# Patient Record
Sex: Female | Born: 1980 | Hispanic: Yes | Marital: Single | Smoking: Never smoker
Health system: Southern US, Community
[De-identification: ages and names within clinical notes are randomized; demographics above are authoritative.]

## PROBLEM LIST (undated history)

## (undated) DIAGNOSIS — E119 Type 2 diabetes mellitus without complications: Secondary | ICD-10-CM

## (undated) HISTORY — PX: NO PAST SURGERIES: SHX2092

## (undated) HISTORY — DX: Type 2 diabetes mellitus without complications: E11.9

---

## 2015-04-03 LAB — LEAD, BLOOD

## 2015-04-03 LAB — CYTOLOGY - PAP

## 2015-04-04 LAB — SICKLE CELL SCREEN: Sickle Cell Screen: NORMAL

## 2015-04-04 LAB — OB RESULTS CONSOLE GC/CHLAMYDIA
Chlamydia: NEGATIVE
GC PROBE AMP, GENITAL: NEGATIVE

## 2015-04-04 LAB — OB RESULTS CONSOLE HIV ANTIBODY (ROUTINE TESTING): HIV: NONREACTIVE

## 2015-04-04 LAB — OB RESULTS CONSOLE HEPATITIS B SURFACE ANTIGEN: Hepatitis B Surface Ag: NEGATIVE

## 2015-04-04 LAB — OB RESULTS CONSOLE RUBELLA ANTIBODY, IGM: RUBELLA: IMMUNE

## 2015-04-04 LAB — OB RESULTS CONSOLE VARICELLA ZOSTER ANTIBODY, IGG: VARICELLA IGG: IMMUNE

## 2015-04-05 ENCOUNTER — Encounter: Payer: Self-pay | Admitting: *Deleted

## 2015-04-10 ENCOUNTER — Other Ambulatory Visit: Payer: Self-pay | Admitting: Obstetrics & Gynecology

## 2015-04-10 ENCOUNTER — Encounter: Payer: Self-pay | Attending: Family Medicine | Admitting: *Deleted

## 2015-04-10 ENCOUNTER — Ambulatory Visit: Payer: Self-pay | Admitting: *Deleted

## 2015-04-10 DIAGNOSIS — O24112 Pre-existing diabetes mellitus, type 2, in pregnancy, second trimester: Secondary | ICD-10-CM

## 2015-04-10 DIAGNOSIS — Z713 Dietary counseling and surveillance: Secondary | ICD-10-CM | POA: Insufficient documentation

## 2015-04-10 DIAGNOSIS — O24919 Unspecified diabetes mellitus in pregnancy, unspecified trimester: Secondary | ICD-10-CM | POA: Insufficient documentation

## 2015-04-10 DIAGNOSIS — Z794 Long term (current) use of insulin: Secondary | ICD-10-CM | POA: Insufficient documentation

## 2015-04-10 DIAGNOSIS — O24319 Unspecified pre-existing diabetes mellitus in pregnancy, unspecified trimester: Secondary | ICD-10-CM | POA: Insufficient documentation

## 2015-04-10 MED ORDER — GLYBURIDE 5 MG PO TABS: 5.0000 mg | ORAL_TABLET | Freq: Two times a day (BID) | ORAL | Status: AC

## 2015-04-10 NOTE — Progress Notes (Signed)
Patient here for diabetes education.  CBG noted to be 280.  Pt had stopped metformin in jan.  Pt to restart with graduated increase in dose back to 1000 mg (see DM note).  Will also start glyburide 5 mg bid.  Patient has first OB visit with Dr. Shawnie Pons next Monday.  If still not controlled, then consider switching to insulin.

## 2015-04-10 NOTE — Progress Notes (Signed)
Subjective:     Patient ID: Joy Wallace, female   DOB: 12-23-1980, 34 y.o.   MRN: [id number]  HPI   Review of Systems     Objective:   Physical Exam     Assessment:     Patient here for Diabetes Education, 1st Clinic visit. Spanish interpretor and her husband were also present. She initially stated she did not have Diabetes but ultimately stated she was on Metformin since her last pregnancy but stopped taking it when she became pregnant this time. She states she was given 2 insulin injections during her last pregnancy but did not take it herself. She has not been checking her BG.  The following learning objectives were met by the patient during this course:   States the definition of Gestational Diabetes  States when to check blood glucose levels  Demonstrates proper blood glucose monitoring techniques  Understands to resume Rx of Metformin @ 1/2 pill every AM with Breakfast for 1 week, add another 1/2 pill at dinner for the 2nd week.  Understands she has a new Rx for Glyburide to pick up today, and to take 1 pill before Breakfast every day  Blood glucose monitor given: True Track Lot # [id number] Exp: 05/20/2017 Blood glucose reading: 284 mg/dl * I reported this to Dr. Gala Romney who instructed the patient on medications today.  Patient instructed to monitor glucose levels: FBS: 60 - <90 2 hour: <120  *Patient received handouts:  Nutrition Diabetes and Pregnancy in Spanish  Carbohydrate Counting List in Spanish  Patient will be seen for follow-up as needed. She has an appointment next Monday with MD here.      Plan:     Patient instructed to monitor glucose levels: FBS: 60 - <90 2 hour: <120  Take Metformin @ 1/2 pill every AM with Breakfast for 1 week, add another 1/2 pill at dinner for the 2nd week. Glyburide  take 1 pill before Breakfast every day These instructions were written in Spanish by interpretor for the patient.  Patient will be seen for  follow-up as needed. She has an appointment next Monday with MD here.

## 2015-04-17 ENCOUNTER — Ambulatory Visit (INDEPENDENT_AMBULATORY_CARE_PROVIDER_SITE_OTHER): Payer: Self-pay | Admitting: Family Medicine

## 2015-04-17 ENCOUNTER — Encounter: Payer: Self-pay | Admitting: Family Medicine

## 2015-04-17 VITALS — BP 118/83 | HR 88 | Ht 62.99 in | Wt 127.9 lb

## 2015-04-17 DIAGNOSIS — O24919 Unspecified diabetes mellitus in pregnancy, unspecified trimester: Secondary | ICD-10-CM

## 2015-04-17 DIAGNOSIS — O0990 Supervision of high risk pregnancy, unspecified, unspecified trimester: Secondary | ICD-10-CM | POA: Insufficient documentation

## 2015-04-17 DIAGNOSIS — O0992 Supervision of high risk pregnancy, unspecified, second trimester: Secondary | ICD-10-CM

## 2015-04-17 DIAGNOSIS — Z789 Other specified health status: Secondary | ICD-10-CM

## 2015-04-17 DIAGNOSIS — E119 Type 2 diabetes mellitus without complications: Secondary | ICD-10-CM

## 2015-04-17 DIAGNOSIS — O24319 Unspecified pre-existing diabetes mellitus in pregnancy, unspecified trimester: Secondary | ICD-10-CM

## 2015-04-17 LAB — COMPREHENSIVE METABOLIC PANEL
ALK PHOS: 75 U/L (ref 39–117)
ALT: 10 U/L (ref 0–35)
AST: 11 U/L (ref 0–37)
Albumin: 3.4 g/dL — ABNORMAL LOW (ref 3.5–5.2)
BILIRUBIN TOTAL: 0.7 mg/dL (ref 0.2–1.2)
BUN: 8 mg/dL (ref 6–23)
CO2: 21 mEq/L (ref 19–32)
Calcium: 8.8 mg/dL (ref 8.4–10.5)
Chloride: 100 mEq/L (ref 96–112)
Creat: 0.36 mg/dL — ABNORMAL LOW (ref 0.50–1.10)
Glucose, Bld: 196 mg/dL — ABNORMAL HIGH (ref 70–99)
POTASSIUM: 3.9 meq/L (ref 3.5–5.3)
Sodium: 133 mEq/L — ABNORMAL LOW (ref 135–145)
Total Protein: 6.3 g/dL (ref 6.0–8.3)

## 2015-04-17 LAB — POCT URINALYSIS DIP (DEVICE)
BILIRUBIN URINE: NEGATIVE
Glucose, UA: 500 mg/dL — AB
Hgb urine dipstick: NEGATIVE
Ketones, ur: NEGATIVE mg/dL
Leukocytes, UA: NEGATIVE
NITRITE: NEGATIVE
PH: 6.5 (ref 5.0–8.0)
PROTEIN: NEGATIVE mg/dL
Specific Gravity, Urine: 1.02 (ref 1.005–1.030)
Urobilinogen, UA: 0.2 mg/dL (ref 0.0–1.0)

## 2015-04-17 LAB — HEMOGLOBIN A1C
Hgb A1c MFr Bld: 9.5 % — ABNORMAL HIGH (ref <5.7)
Mean Plasma Glucose: 226 mg/dL — ABNORMAL HIGH (ref <117)

## 2015-04-17 LAB — TSH: TSH: 1.293 u[IU]/mL (ref 0.350–4.500)

## 2015-04-17 MED ORDER — INSULIN NPH (HUMAN) (ISOPHANE) 100 UNIT/ML ~~LOC~~ SUSP: 8.0000 [IU] | Freq: Two times a day (BID) | SUBCUTANEOUS | Status: DC

## 2015-04-17 MED ORDER — INSULIN REGULAR HUMAN 100 UNIT/ML IJ SOLN: 8.0000 [IU] | Freq: Two times a day (BID) | INTRAMUSCULAR | Status: DC

## 2015-04-17 MED ORDER — ASPIRIN 81 MG PO CHEW: 81.0000 mg | CHEWABLE_TABLET | Freq: Every day | ORAL | Status: AC

## 2015-04-17 NOTE — Progress Notes (Signed)
Subjective:  Joy Wallace is a 34 y.o. [id number] at [redacted]w[redacted]d being seen today for transfer from [facility] due to DM - Class B and ongoning prenatal care.  Patient reports no complaints.  Contractions: Not present.  Vag. Bleeding: None. Movement: Present. Denies leaking of fluid.   The following portions of the patient's history were reviewed and updated as appropriate: allergies, current medications, past family history, past medical history, past social history, past surgical history and problem list.   Objective:   Filed Vitals:   04/17/15 0749 04/17/15 0750  BP: 118/83   Pulse: 88   Height:  5' 2.99" (1.6 m)  Weight: 127 lb 14.4 oz (58.015 kg)     Fetal Status: Fetal Heart Rate (bpm): 150   Movement: Present     General:  Alert, oriented and cooperative. Patient is in no acute distress.  Skin: Skin is warm and dry. No rash noted.   Cardiovascular: Normal heart rate noted  Respiratory: Normal respiratory effort, no problems with respiration noted  Abdomen: Soft, gravid, appropriate for gestational age. Pain/Pressure: Absent     Vaginal: Vag. Bleeding: None.       Extremities: Normal range of motion.  Edema: None  Mental Status: Normal mood and affect. Normal behavior. Normal judgment and thought content.   Urinalysis:  Protein Negative  Glucose Negative FBS 100-299 2 hr pp 196-507 Assessment and Plan:  Pregnancy: [id number] at [redacted]w[redacted]d  1. Pre-existing diabetes mellitus affecting pregnancy, antepartum Given how high her BS are--will need insulin--begin weight based, second trimester NPH and Regular due to cost and no health insurance. - Comprehensive metabolic panel - Protein, urine, 24 hour - US OB Detail + 14 Wk; Future - Ambulatory referral to Pediatric Cardiology - Ambulatory referral to Ophthalmology - aspirin 81 MG chewable tablet; Chew 1 tablet (81 mg total) by mouth daily.  Dispense: 90 tablet; Refill: 3 - TSH - Hemoglobin A1c - insulin NPH Human (HUMULIN N,NOVOLIN N)  100 UNIT/ML injection; Inject 0.08-0.2 mLs (8-20 Units total) into the skin 2 (two) times daily. 20 units q am, 8 units q hs  Dispense: 10 mL; Refill: 3 - insulin regular (HUMULIN R) 100 units/mL injection; Inject 0.08-0.1 mLs (8-10 Units total) into the skin 2 (two) times daily before a meal. 10 u q am and 8 u q ac evening meal  Dispense: 10 mL; Refill: 11  2. Supervision of high risk pregnancy, antepartum, second trimester Schedule anatomy u/s  3. Type 2 diabetes mellitus without complication Opthalmology referral  4. Language barrier affecting health care Spanish interpreter: [name] used    Preterm labor symptoms and general obstetric precautions including but not limited to vaginal bleeding, contractions, leaking of fluid and fetal movement were reviewed in detail with the patient.  Please refer to After Visit Summary for other counseling recommendations.   Return in 2 weeks (on 05/01/2015).   Reva Bores, MD

## 2015-04-17 NOTE — Progress Notes (Signed)
Nutrition note: 1st visit consult & DM diet education Pt reports she has had Type 2 DM for 7 years but has not received any nutrition education. Pt has gained 9.9# @ 22w, which is wnl. Pt reports eating 3 meals & 2 snacks/d. Pt is taking a PNV. Pt reports no N&V or heartburn. NKFA. Pt reports walking ~30 mins most days. Pt received verbal & written education in Spanish via an interpreter about DM diet during pregnancy. Discussed wt gain goals of 25-35# or 1#/wk. Pt agrees to follow DM diet with 3 meals & 2-3 snacks/d with proper CHO/ protein combination.  Pt has WIC & plans to BF. F/u in 4-6 wks Blondell Reveal, MS, RD, LDN, [facility]

## 2015-04-17 NOTE — Progress Notes (Signed)
Anatomy ultrasound scheduled for 04/19/2015@ 8:30AM Fetal echo with [facility] on 04/27/2015 @ 11:00 [facility] directly to schedule appointment. Verified patient phone numbers and address, ok to leave message per patient. Advised will need Spanish interpreter. Appointment with [facility] scheduled for 04/20/2015 @11 :30 AM. Advised to take oral medications until she can attend class to learn blood sugar monitoring and insulin injections.

## 2015-04-17 NOTE — Patient Instructions (Signed)
Diabetes mellitus gestacional (Gestational Diabetes Mellitus) La diabetes mellitus gestacional, ms comnmente conocida como diabetes gestacional es un tipo de diabetes que desarrollan algunas mujeres durante el embarazo. En la diabetes gestacional, el pncreas no produce suficiente insulina (una hormona) o las clulas son menos sensibles a la insulina producida (resistencia a la insulina), o ambas cosas. Normalmente, la insulina mueve los azcares de los alimentos a las clulas de los tejidos. Las clulas de los tejidos utilizan los azcares para obtener energa. La falta de insulina o la falta de una respuesta normal a la insulina hace que el exceso de azcar se acumule en la sangre en lugar de penetrar en las clulas de los tejidos. Como resultado, se producen niveles altos de azcar en la sangre (hiperglucemia). El efecto de los niveles altos de azcar (glucosa) puede causar muchos problemas.  FACTORES DE RIESGO Usted tiene mayor probabilidad de desarrollar diabetes gestacional si tiene antecedentes familiares de diabetes y tambin si tiene uno o ms de los siguientes factores de riesgo:  ndice de masa corporal superior a 30 (obesidad).  Embarazo previo con diabetes gestacional.  La edad avanzada en el momento del embarazo. Si se mantienen los niveles de glucosa en la sangre en un rango normal durante el embarazo, las mujeres pueden tener un embarazo saludable. Si los niveles de glucosa en la sangre no estn bien controlados, puede haber riesgos para usted, el feto o el recin nacido, o durante el trabajo de parto y el parto.  SNTOMAS  Si se presentan sntomas, stos son similares a los sntomas que normalmente experimentar durante el embarazo. Los sntomas de la diabetes gestacional son:   Aumento de la sed (polidipsia).  Aumento de la miccin (poliuria).  Orina con ms frecuencia durante la noche (nocturia).  Prdida de peso. La prdida de peso puede ser muy rpida.  Infecciones  frecuentes y recurrentes.  Cansancio (fatiga).  Debilidad.  Cambios en la visin, como visin borrosa.  Olor a fruta en el aliento.  Dolor abdominal. DIAGNSTICO La diabetes se diagnostica cuando hay aumento de los niveles de glucosa en la sangre. El nivel de glucosa en la sangre puede controlarse en uno o ms de los siguientes anlisis de sangre:  Medicin de glucosa en la sangre en ayunas. No se le permitir comer durante al menos 8 horas antes de que se tome una muestra de sangre.  Pruebas al azar de glucosa en la sangre. El nivel de glucosa en la sangre se controla en cualquier momento del da sin importar el momento en que haya comido.  Prueba de A1c (hemoglobina glucosilada) Una prueba de A1c proporciona informacin sobre el control de la glucosa en la sangre durante los ltimos 3 meses.  Prueba de tolerancia a la glucosa oral (PTGO). La glucosa en la sangre se mide despus de no haber comido (ayunas) durante una a tres horas y despus de beber una bebida que contenga glucosa. Dado que las hormonas que causan la resistencia a la insulina son ms altas alrededor de las semanas 24 a 28 de embarazo, generalmente se realiza una PTGO durante ese tiempo. Si tiene factores de riesgo de diabetes gestacional, su mdico puede hacerle estudios de deteccin antes de las 24semanas de embarazo. TRATAMIENTO   Usted tendr que tomar medicamentos para la diabetes o insulina diariamente para mantener los niveles de glucosa en la sangre en el rango deseado.  Usted tendr que combinar la dosis de insulina con la actividad fsica y la eleccin de alimentos saludables. El objetivo del   tratamiento es mantener el nivel de azcar en la sangre previo a comer (preprandial) y durante la noche entre 60 y 99mg/dl, durante todo el embarazo. El objetivo del tratamiento es mantener el nivel pico de azcar en la sangre despus de comer (glucosa posprandial) entre 100y 140mg/dl. INSTRUCCIONES PARA EL CUIDADO EN EL  HOGAR   Controle su nivel de hemoglobina A1c dos veces al ao.  Contrlese a diario el nivel de glucosa en la sangre segn las indicaciones de su mdico. Es comn realizar controles frecuentes de la glucosa en la sangre.  Supervise las cetonas en la orina cuando est enferma y segn las indicaciones de su mdico.  Tome el medicamento para la diabetes y adminstrese insulina segn las indicaciones de su mdico para mantener el nivel de glucosa en la sangre en el rango deseado.  Nunca se quede sin medicamento para la diabetes o sin insulina. Es necesario que la reciba todos los das.  Ajuste la insulina segn la ingesta de hidratos de carbono. Los hidratos de carbono pueden aumentar los niveles de glucosa en la sangre, pero deben incluirse en su dieta. Los hidratos de carbono aportan vitaminas, minerales y fibra que son una parte esencial de una dieta saludable. Los hidratos de carbono se encuentran en frutas, verduras, cereales integrales, productos lcteos, legumbres y alimentos que contienen azcares aadidos.  Consuma alimentos saludables. Alterne 3 comidas con 3 colaciones.  Aumente de peso saludablemente. El aumento del peso total vara de acuerdo con el ndice de masa corporal que tena antes del embarazo ([facility]).  Lleve una tarjeta de alerta mdica o use una pulsera o medalla de alerta mdica.  Lleve con usted una colacin de 15gramos de hidratos de carbono en todo momento para controlar los niveles bajos de glucosa en la sangre (hipoglucemia). Algunos ejemplos de colaciones de 15gramos de hidratos de carbono son los siguientes:  Tabletas de glucosa, 3 o 4.  Gel de glucosa, tubo de 15 gramos.  Pasas de uva, 2 cucharadas (24 g).  Caramelos de goma, 6.  Galletas de animales, 8.  Jugo de fruta, gaseosa comn, o leche descremada, 4 onzas (120 ml).  Pastillas de goma, 9.  Reconocer la hipoglucemia. Durante el embarazo la hipoglucemia se produce cuando hay niveles de glucosa en la  sangre de 60 mg/dl o menos. El riesgo de hipoglucemia aumenta durante el ayuno o cuando se saltea las comidas, durante o despus de realizar ejercicio intenso y mientras duerme. Los sntomas de hipoglucemia son:  Temblores o sacudidas.  Disminucin de la capacidad de concentracin.  Sudoracin.  Aumento de la frecuencia cardaca.  Dolor de cabeza.  Sequedad en la boca.  Hambre.  Irritabilidad.  Ansiedad.  Sueo agitado.  Alteracin del habla o de la coordinacin.  Confusin.  Tratar la hipoglucemia rpidamente. Si usted est alerta y puede tragar con seguridad, siga la regla de 15/15 que consiste en:  Tome entre 15 y 20gramos de glucosa de accin rpida o carbohidratos. Las opciones de accin rpida son un gel de glucosa, tabletas de glucosa, o 4 onzas (120 ml) de jugo de frutas, gaseosa comn, o leche baja en grasa.  Compruebe su nivel de glucosa en la sangre 15 minutos despus de tomar la glucosa.  Tome entre 15 y 20 gramos ms de glucosa si el nivel de glucosa en la sangre todava es de 70mg/dl o inferior.  Ingiera una comida o una colacin en el lapso de 1 hora una vez que los niveles de glucosa en la sangre vuelven   a la normalidad.  Est atento a la poliuria (miccin excesiva) y la polidipsia (sensacin de mucha sed), que son los primeros signos de la hiperglucemia. El reconocimiento temprano de la hiperglucemia permite un tratamiento oportuno. Trate la hiperglucemia segn le indic su mdico.  Haga actividad fsica por lo menos 30minutos al da o como lo indique su mdico. Se recomienda que 30 minutos despus de cada comida, realice diez minutos de actividad fsica para controlar los niveles de glucosa postprandial en la sangre.  Ajuste su dosis de insulina y la ingesta de alimentos, segn sea necesario, si inicia un nuevo ejercicio o deporte.  Siga su plan para los das de enfermedad cuando no pueda comer o beber como de costumbre.  Evite el tabaco y el  alcohol.  Concurra a todas las visitas de control como se lo haya indicado el mdico.  Siga el consejo del mdico respecto a los controles prenatales y posteriores al parto (postparto), las visitas, la planificacin de las comidas, el ejercicio, los medicamentos, las vitaminas, los anlisis de sangre, otras pruebas mdicas y actividades fsicas.  Realice diariamente el cuidado de la piel y de los pies. Examine su piel y los pies diariamente para ver si tiene cortes, moretones, enrojecimiento, problemas en las uas, sangrado, ampollas o llagas.  Cepllese los dientes y encas por lo menos dos veces al da y use hilo dental al menos una vez por da. Concurra regularmente a las visitas de control con el dentista.  Programe un examen de vista durante el primer trimestre de su embarazo o como lo indique su mdico.  Comparta su plan de control de diabetes en el trabajo o en la escuela.  Mantngase al da con las vacunas.  Aprenda a manejar el estrs.  Obtenga la mayor cantidad posible de informacin sobre la diabetes y solicite ayuda siempre que sea necesario.  Obtenga informacin sobre el amamantamiento y analice esta posibilidad.  Debe controlar el nivel de azcar en la sangre de 6a 12semanas despus del parto. Esto se hace con una prueba de tolerancia a la glucosa oral (PTGO). SOLICITE ATENCIN MDICA SI:   No puede comer alimentos o beber por ms de 6 horas.  Tuvo nuseas o ha vomitado durante ms de 6 horas.  Tiene un nivel de glucosa en la sangre de 200 mg/dl y cetonas en la orina.  Presenta algn cambio en el estado mental.  Desarrolla problemas de visin.  Sufre un dolor persistente de cabeza.  Siente dolor o molestias en la parte superior del abdomen.  Desarrolla una enfermedad grave adicional.  Tuvo diarrea durante ms de 6 horas.  Ha estado enfermo o ha tenido fiebre durante un par de das y no mejora. SOLICITE ATENCIN MDICA DE INMEDIATO SI:   Tiene dificultad  para respirar.  Ya no siente los movimientos del beb.  Est sangrando o tiene flujo vaginal.  Comienza a tener contracciones o trabajo de parto prematuro. ASEGRESE DE QUE:  Comprende estas instrucciones.  Controlar su afeccin.  Recibir ayuda de inmediato si no mejora o si empeora. Document Released: 07/17/2005 Document Revised: 02/21/2014 ExitCare Patient Information 2015 [employer]. This information is not intended to replace advice given to you by your health care provider. Make sure you discuss any questions you have with your health care provider.  Segundo trimestre de embarazo (Second Trimester of Pregnancy) El segundo trimestre va desde la semana13 hasta la 28, desde el cuarto hasta el sexto mes, y suele ser el momento en el que mejor se   siente. Su organismo se ha adaptado a Charity fundraiser y comienza a Diplomatic Services operational officer. En general, las nuseas matutinas han disminuido o han desaparecido completamente, p El segundo trimestre es tambin la poca en la que el feto se desarrolla rpidamente. Hacia el final del sexto mes, el feto mide aproximadamente 9pulgadas (23cm) y pesa alrededor de 1 libras (700g). Es probable que sienta que el beb se Teacher, English as a foreign language (da pataditas) entre las 18 y 20semanas del Psychiatrist. CAMBIOS EN EL ORGANISMO Su organismo atraviesa por muchos cambios durante el [place], y estos varan de Neomia Dear mujer a Educational psychologist.   Seguir [employer]. Notar que la parte baja del abdomen sobresale.  Podrn aparecer las primeras [employer] caderas, el abdomen y las [place].  Es posible que tenga dolores de cabeza que pueden aliviarse con los medicamentos que su mdico autorice.  Tal vez tenga necesidad de orinar con ms frecuencia porque el feto est ejerciendo presin Ambulance person.  Debido al Vanetta Mulders podr sentir Anthoney Harada estomacal con frecuencia.  Puede estar estreida, ya que ciertas hormonas enlentecen los movimientos de los msculos que [employer]  desechos a travs de los intestinos.  Pueden aparecer hemorroides o abultarse e hincharse las venas (venas varicosas).  Puede tener dolor de espalda que se debe al [employer] de peso y a que las hormonas del Management consultant las articulaciones entre los huesos de la pelvis, y Public librarian consecuencia de la modificacin del peso y los msculos que mantienen el equilibrio.  Las [employer] seguirn creciendo y Development worker, community.  Las Veterinary surgeon y estar sensibles al cepillado y al hilo dental.  Pueden aparecer zonas oscuras o manchas (cloasma, mscara del Psychiatrist) en el rostro que probablemente se atenuarn despus del nacimiento del beb.  Es posible que se forme una lnea oscura desde el ombligo hasta la zona del pubis (linea nigra) que probablemente se atenuarn despus del nacimiento del beb.  Tal vez haya cambios en el cabello que pueden incluir su engrosamiento, crecimiento rpido y cambios en la textura. Adems, a algunas mujeres se les cae el cabello durante o despus del embarazo, o tienen el cabello seco o fino. Lo ms probable es que el cabello se le normalice despus del nacimiento del beb. QU DEBE ESPERAR EN LAS CONSULTAS PRENATALES Durante una visita prenatal de rutina:  La pesarn para asegurarse de que usted y el feto estn creciendo normalmente.  Le tomarn la presin arterial.  Le medirn el abdomen para controlar el desarrollo del beb.  Se escucharn los latidos cardacos fetales.  Se evaluarn los resultados de los estudios solicitados en visitas anteriores. El mdico puede preguntarle lo siguiente:  Cmo se siente.  Si siente los movimientos del beb.  Si ha tenido sntomas anormales, como prdida de lquido, [place], dolores de cabeza intensos o clicos abdominales.  Si tiene [employer]. Otros estudios que podrn realizarse durante el segundo trimestre incluyen lo siguiente:  Anlisis de sangre para detectar:  Concentraciones de hierro bajas  (anemia).  Diabetes gestacional (entre la semana 24 y la 28).  Anticuerpos Rh.  Anlisis de orina para detectar infecciones, diabetes o protenas en la orina.  Una ecografa para confirmar que el beb crece y se desarrolla correctamente.  Una amniocentesis para diagnosticar posibles problemas genticos.  Estudios del feto para descartar espina bfida y sndrome de [place]. INSTRUCCIONES PARA EL CUIDADO EN EL HOGAR   Evite fumar, consumir hierbas, beber alcohol y tomar frmacos que no le hayan recetado. Estas sustancias qumicas afectan la formacin  y el desarrollo del beb.  Siga las indicaciones del mdico en relacin con el uso de medicamentos. Durante el embarazo, hay medicamentos que son seguros de tomar y otros que no.  Haga actividad fsica solo en la forma indicada por el mdico. Sentir clicos uterinos es un buen signo para detener la actividad fsica.  Contine comiendo alimentos que sanos con regularidad.  Use un sostn que le brinde buen soporte si le duelen las mamas.  No se d baos de inmersin en agua caliente, baos turcos ni saunas.  Colquese el cinturn de seguridad cuando conduzca.  No coma carne cruda ni queso sin cocinar; evite el contacto con las bandejas sanitarias de los gatos y la tierra que estos animales usan. Estos elementos contienen grmenes que pueden causar defectos congnitos en el beb.  Tome las vitaminas prenatales.  Si est estreida, pruebe un laxante suave (si el mdico lo autoriza). Consuma ms alimentos ricos en fibra, como vegetales y frutas frescos y cereales integrales. Beba gran cantidad de lquido para mantener la orina de tono claro o color amarillo plido.  Dese baos de asiento con agua tibia para aliviar el dolor o las molestias causadas por las hemorroides. Use una crema para las hemorroides si el mdico la autoriza.  Si tiene venas varicosas, use medias de descanso. Eleve los pies durante 15minutos, 3 o 4veces por da. Limite la  cantidad de sal en su dieta.  No levante objetos pesados, use zapatos de tacones bajos y mantenga una buena postura.  Descanse con las piernas elevadas si tiene calambres o dolor de cintura.  Visite a su dentista si an no lo ha hecho durante el embarazo. Use un cepillo de dientes blando para higienizarse los dientes y psese el hilo dental con suavidad.  Puede seguir manteniendo relaciones sexuales, a menos que el mdico le indique lo contrario.  Concurra a todas las visitas prenatales segn las indicaciones de su mdico. SOLICITE ATENCIN MDICA SI:   Tiene mareos.  Siente clicos leves, presin en la pelvis o dolor persistente en el abdomen.  Tiene nuseas, vmitos o diarrea persistentes.  Tiene secrecin vaginal con mal olor.  Siente dolor al orinar. SOLICITE ATENCIN MDICA DE INMEDIATO SI:   Tiene fiebre.  Tiene una prdida de lquido por la vagina.  Tiene sangrado o pequeas prdidas vaginales.  Siente dolor intenso o clicos en el abdomen.  Sube o baja de peso rpidamente.  Tiene dificultad para respirar y siente dolor de pecho.  Sbitamente se le hinchan mucho el rostro, las manos, los tobillos, los pies o las piernas.  No ha sentido los movimientos del beb durante una hora.  Siente un dolor de cabeza intenso que no se alivia con medicamentos.  Hay cambios en la visin. Document Released: 07/17/2005 Document Revised: 10/12/2013 ExitCare Patient Information 2015 [employer]. This information is not intended to replace advice given to you by your health care provider. Make sure you discuss any questions you have with your health care provider.  Lactancia materna (Breastfeeding) Decidir amamantar es una de las mejores elecciones que puede hacer por usted y su beb. El cambio hormonal durante el embarazo produce el desarrollo del tejido mamario y aumenta la cantidad y el tamao de los conductos galactforos. Estas hormonas tambin permiten que las protenas,  los azcares y las grasas de la sangre produzcan la leche materna en las glndulas productoras de leche. Las hormonas impiden que la leche materna sea liberada antes del nacimiento del beb, adems de impulsar el flujo   de leche luego del nacimiento. Una vez que ha comenzado a amamantar, pensar en el beb, as como la succin o el llanto, pueden estimular la liberacin de leche de las glndulas productoras de leche.  LOS BENEFICIOS DE AMAMANTAR Para el beb  La primera leche (calostro) ayuda a mejorar el funcionamiento del sistema digestivo del beb.  La leche tiene anticuerpos que ayudan a prevenir las infecciones en el beb.  El beb tiene una menor incidencia de asma, alergias y del sndrome de muerte sbita del lactante.  Los nutrientes en la leche materna son mejores para el beb que la leche maternizada y estn preparados exclusivamente para cubrir las necesidades del beb.  La leche materna mejora el desarrollo cerebral del beb.  Es menos probable que el beb desarrolle otras enfermedades, como obesidad infantil, asma o diabetes mellitus de tipo 2. Para usted   La lactancia materna favorece el desarrollo de un vnculo muy especial entre la madre y el beb.  Es conveniente. La leche materna siempre est disponible a la temperatura correcta y es econmica.  La lactancia materna ayuda a quemar caloras y a perder el peso ganado durante el embarazo.  Favorece la contraccin del tero al tamao que tena antes del embarazo de manera ms rpida y disminuye el sangrado (loquios) despus del parto.  La lactancia materna contribuye a reducir el riesgo de desarrollar diabetes mellitus de tipo 2, osteoporosis o cncer de mama o de ovario en el futuro. SIGNOS DE QUE EL BEB EST HAMBRIENTO Primeros signos de hambre  Aumenta su estado de alerta o actividad.  Se estira.  Mueve la cabeza de un lado a otro.  Mueve la cabeza y abre la boca cuando se le toca la mejilla o la comisura de la  boca (reflejo de bsqueda).  Aumenta las vocalizaciones, tales como sonidos de succin, se relame los labios, emite arrullos, suspiros, o chirridos.  Mueve la mano hacia la boca.  Se chupa con ganas los dedos o las manos. Signos tardos de hambre  Est agitado.  Llora de manera intermitente. Signos de hambre extrema Los signos de hambre extrema requerirn que lo calme y lo consuele antes de que el beb pueda alimentarse adecuadamente. No espere a que se manifiesten los siguientes signos de hambre extrema para comenzar a amamantar:   Agitacin.  Llanto intenso y fuerte.   Gritos. INFORMACIN BSICA SOBRE LA LACTANCIA MATERNA Iniciacin de la lactancia materna  Encuentre un lugar cmodo para sentarse o acostarse, con un buen respaldo para el cuello y la espalda.  Coloque una almohada o una manta enrollada debajo del beb para acomodarlo a la altura de la mama (si est sentada). Las almohadas para amamantar se han diseado especialmente a fin de servir de apoyo para los brazos y el beb mientras amamanta.  Asegrese de que el abdomen del beb est frente al suyo.  Masajee suavemente la mama. Con las yemas de los dedos, masajee la pared del pecho hacia el pezn en un movimiento circular. Esto estimula el flujo de leche. Es posible que deba continuar este movimiento mientras amamanta si la leche fluye lentamente.  Sostenga la mama con el pulgar por arriba del pezn y los otros 4 dedos por debajo de la mama. Asegrese de que los dedos se encuentren lejos del pezn y de la boca del beb.  Empuje suavemente los labios del beb con el pezn o con el dedo.  Cuando la boca del beb se abra lo suficiente, acrquelo rpidamente a la mama   e introduzca todo el pezn y la zona oscura que lo rodea (areola), tanto como sea posible, dentro de la boca del beb.  Debe haber ms areola visible por arriba del labio superior del beb que por debajo del labio inferior.  La lengua del beb debe estar  entre la enca inferior y la mama.  Asegrese de que la boca del beb est en la posicin correcta alrededor del pezn (prendida). Los labios del beb deben crear un sello sobre la mama y estar doblados hacia afuera (invertidos).  Es comn que el beb succione durante 2 a 3 minutos para que comience el flujo de leche materna. Cmo debe prenderse Es muy importante que le ensee al beb cmo prenderse adecuadamente a la mama. Si el beb no se prende adecuadamente, puede causarle dolor en el pezn y reducir la produccin de leche materna, y hacer que el beb tenga un escaso aumento de peso. Adems, si el beb no se prende adecuadamente al pezn, puede tragar aire durante la alimentacin. Esto puede causarle molestias al beb. Hacer eructar al beb al cambiar de mama puede ayudarlo a liberar el aire. Sin embargo, ensearle al beb cmo prenderse a la mama adecuadamente es la mejor manera de evitar que se sienta molesto por tragar aire mientras se alimenta. Signos de que el beb se ha prendido adecuadamente al pezn:   Tironea o succiona de modo silencioso, sin causarle dolor.  Se escucha que traga cada 3 o 4 succiones.   Hay movimientos musculares por arriba y por delante de sus odos al succionar. Signos de que el beb no se ha prendido adecuadamente al pezn:   Hace ruidos de succin o de chasquido mientras se alimenta.  Siente dolor en el pezn. Si cree que el beb no se prendi correctamente, deslice el dedo en la comisura de la boca y colquelo entre las encas del beb para interrumpir la succin. Intente comenzar a amamantar nuevamente. Signos de lactancia materna exitosa Signos del beb:   Disminuye gradualmente el nmero de succiones o cesa la succin por completo.  Se duerme.  Relaja el cuerpo.  Retiene una pequea cantidad de leche en la boca.  Se desprende solo del pecho. Signos que presenta usted:  Las mamas han aumentado la firmeza, el peso y el tamao 1 a 3 horas despus  de amamantar.  Estn ms blandas inmediatamente despus de amamantar.  Un aumento del volumen de leche, y tambin un cambio en su consistencia y color se producen hacia el quinto da de lactancia materna.  Los pezones no duelen, ni estn agrietados ni sangran. Signos de que su beb recibe la cantidad de leche suficiente  Moja al menos 3 paales en 24 horas. La orina debe ser clara y de color amarillo plido a los 5 das de vida.  Defeca al menos 3 veces en 24 horas a los 5 das de vida. La materia fecal debe ser blanda y amarillenta.  Defeca al menos 3 veces en 24 horas a los 7 das de vida. La materia fecal debe ser grumosa y amarillenta.  No registra una prdida de peso mayor del 10% del peso al nacer durante los primeros 3 das de vida.  Aumenta de peso un promedio de 4 a 7onzas (113 a 198g) por semana despus de los 4 das de vida.  Aumenta de peso, diariamente, de manera uniforme a partir de los 5 das de vida, sin registrar prdida de peso despus de las 2semanas de vida. Despus de alimentarse,   es posible que el beb regurgite una pequea cantidad. Esto es frecuente. FRECUENCIA Y DURACIN DE LA LACTANCIA MATERNA El amamantamiento frecuente la ayudar a producir ms leche y a prevenir problemas de dolor en los pezones e hinchazn en las mamas. Alimente al beb cuando muestre signos de hambre o si siente la necesidad de reducir la congestin de las mamas. Esto se denomina "lactancia a demanda". Evite el uso del chupete mientras trabaja para establecer la lactancia (las primeras 4 a 6 semanas despus del nacimiento del beb). Despus de este perodo, podr ofrecerle un chupete. Las investigaciones demostraron que el uso del chupete durante el primer ao de vida del beb disminuye el riesgo de desarrollar el sndrome de muerte sbita del lactante (SMSL). Permita que el nio se alimente en cada mama todo lo que desee. Contine amamantando al beb hasta que haya terminado de alimentarse.  Cuando el beb se desprende o se queda dormido mientras se est alimentando de la primera mama, ofrzcale la segunda. Debido a que, con frecuencia, los recin nacidos permanecen somnolientos las primeras semanas de vida, es posible que deba despertar al beb para alimentarlo. Los horarios de lactancia varan de un beb a otro. Sin embargo, las siguientes reglas pueden servir como gua para ayudarla a garantizar que el beb se alimenta adecuadamente:  Se puede amamantar a los recin nacidos (bebs de 4 semanas o menos de vida) cada 1 a 3 horas.  No deben transcurrir ms de 3 horas durante el da o 5 horas durante la noche sin que se amamante a los recin nacidos.  Debe amamantar al beb 8 veces como mnimo en un perodo de 24 horas, hasta que comience a introducir slidos en su dieta, a los 6 meses de vida aproximadamente. EXTRACCIN DE LECHE MATERNA La extraccin y el almacenamiento de la leche materna le permiten asegurarse de que el beb se alimente exclusivamente de leche materna, aun en momentos en los que no puede amamantar. Esto tiene especial importancia si debe regresar al trabajo en el perodo en que an est amamantando o si no puede estar presente en los momentos en que el beb debe alimentarse. Su asesor en lactancia puede orientarla sobre cunto tiempo es seguro almacenar leche materna.  El sacaleche es un aparato que le permite extraer leche de la mama a un recipiente estril. Luego, la leche materna extrada puede almacenarse en un refrigerador o congelador. Algunos sacaleches son manuales, mientras que otros son elctricos. Consulte a su asesor en lactancia qu tipo ser ms conveniente para usted. Los sacaleches se pueden comprar; sin embargo, algunos hospitales y grupos de apoyo a la lactancia materna alquilan sacaleches mensualmente. Un asesor en lactancia puede ensearle cmo extraer leche materna manualmente, en caso de que prefiera no usar un sacaleche.  CMO CUIDAR LAS MAMAS DURANTE  LA LACTANCIA MATERNA Los pezones se secan, agrietan y duelen durante la lactancia materna. Las siguientes recomendaciones pueden ayudarla a mantener las mamas humectadas y sanas:  Evite usar jabn en los pezones.  Use un sostn de soporte. Aunque no son esenciales, las camisetas sin mangas o los sostenes especiales para amamantar estn diseados para acceder fcilmente a las mamas, para amamantar sin tener que quitarse todo el sostn o la camiseta. Evite usar sostenes con aro o sostenes muy ajustados.  Seque al aire sus pezones durante 3 a 4minutos despus de amamantar al beb.  Utilice solo apsitos de algodn en el sostn para absorber las prdidas de leche. La prdida de un poco de leche   materna entre las tomas es normal.  Utilice lanolina sobre los pezones luego de amamantar. La lanolina ayuda a mantener la humedad normal de la piel. Si usa lanolina pura, no tiene que lavarse los pezones antes de volver a alimentar al beb. La lanolina pura no es txica para el beb. Adems, puede extraer manualmente algunas gotas de leche materna y masajear suavemente esa leche sobre los pezones, para que la leche se seque al aire. Durante las primeras semanas despus de dar a luz, algunas mujeres pueden experimentar hinchazn en las mamas (congestin mamaria). La congestin puede hacer que sienta las mamas pesadas, calientes y sensibles al tacto. El pico de la congestin ocurre dentro de los 3 a 5 das despus del parto. Las siguientes recomendaciones pueden ayudarla a aliviar la congestin:  Vace por completo las mamas al amamantar o extraer leche. Puede aplicar calor hmedo en las mamas (en la ducha o con toallas hmedas para manos) antes de amamantar o extraer leche. Esto aumenta la circulacin y ayuda a que la leche fluya. Si el beb no vaca por completo las mamas cuando lo amamanta, extraiga la leche restante despus de que haya finalizado.  Use un sostn ajustado (para amamantar o comn) o una camiseta  sin mangas durante 1 o 2 das para indicar al cuerpo que disminuya ligeramente la produccin de leche.  Aplique compresas de hielo sobre las mamas, a menos que le resulte demasiado incmodo.  Asegrese de que el beb est prendido y se encuentre en la posicin correcta mientras lo alimenta. Si la congestin persiste luego de 48 horas o despus de seguir estas recomendaciones, comunquese con su mdico o un asesor en lactancia. RECOMENDACIONES GENERALES PARA EL CUIDADO DE LA SALUD DURANTE LA LACTANCIA MATERNA  Consuma alimentos saludables. Alterne comidas y colaciones, y coma 3 de cada una por da. Dado que lo que come afecta la leche materna, es posible que algunas comidas hagan que su beb se vuelva ms irritable de lo habitual. Evite comer este tipo de alimentos si percibe que afectan de manera negativa al beb.  Beba leche, jugos de fruta y agua para satisfacer su sed (aproximadamente 10 vasos al da).  Descanse con frecuencia, reljese y tome sus vitaminas prenatales para evitar la fatiga, el estrs y la anemia.  Contine con los autocontroles de la mama.  Evite masticar y fumar tabaco.  Evite el consumo de alcohol y drogas. Algunos medicamentos, que pueden ser perjudiciales para el beb, pueden pasar a travs de la leche materna. Es importante que consulte a su mdico antes de tomar cualquier medicamento, incluidos todos los medicamentos recetados y de venta libre, as como los suplementos vitamnicos y herbales. Puede quedar embarazada durante la lactancia. Si desea controlar la natalidad, consulte a su mdico cules son las opciones ms seguras para el beb. SOLICITE ATENCIN MDICA SI:   Usted siente que quiere dejar de amamantar o se siente frustrada con la lactancia.  Siente dolor en las mamas o en los pezones.  Sus pezones estn agrietados o sangran.  Sus pechos estn irritados, sensibles o calientes.  Tiene un rea hinchada en cualquiera de las mamas.  Siente escalofros  o fiebre.  Tiene nuseas o vmitos.  Presenta una secrecin de otro lquido distinto de la leche materna de los pezones.  Sus mamas no se llenan antes de amamantar al beb para el quinto da despus del parto.  Se siente triste y deprimida.  El beb est demasiado somnoliento como para comer bien.  El beb   tiene problemas para dormir.  Moja menos de 3 paales en 24 horas.  Defeca menos de 3 veces en 24 horas.  La piel del beb o la parte blanca de los ojos se vuelven amarillentas.  El beb no ha aumentado de peso a los 5 das de vida. SOLICITE ATENCIN MDICA DE INMEDIATO SI:   El beb est muy cansado (letargo) y no se quiere despertar para comer.  Le sube la fiebre sin causa. Document Released: 10/07/2005 Document Revised: 10/12/2013 Exit[employer] Patient Information 2015 [employer]. This information is not intended to replace advice given to you by your health care provider. Make sure you discuss any questions you have with your health care provider.  

## 2015-04-17 NOTE — Progress Notes (Signed)
Pt reports that she had labs and pap at [facility]. Results not yet received.

## 2015-04-19 ENCOUNTER — Ambulatory Visit (HOSPITAL_COMMUNITY)
Admission: RE | Admit: 2015-04-19 | Discharge: 2015-04-19 | Disposition: A | Discharge status: Home / Self Care | Payer: Self-pay | Source: Ambulatory Visit | Attending: Family Medicine | Admitting: Family Medicine

## 2015-04-19 VITALS — BP 117/89 | HR 103 | Wt 131.2 lb

## 2015-04-19 DIAGNOSIS — O0992 Supervision of high risk pregnancy, unspecified, second trimester: Secondary | ICD-10-CM

## 2015-04-19 DIAGNOSIS — O24319 Unspecified pre-existing diabetes mellitus in pregnancy, unspecified trimester: Secondary | ICD-10-CM

## 2015-04-19 DIAGNOSIS — Z3A19 19 weeks gestation of pregnancy: Secondary | ICD-10-CM | POA: Insufficient documentation

## 2015-04-19 DIAGNOSIS — Z3689 Encounter for other specified antenatal screening: Secondary | ICD-10-CM | POA: Insufficient documentation

## 2015-04-19 DIAGNOSIS — O24919 Unspecified diabetes mellitus in pregnancy, unspecified trimester: Secondary | ICD-10-CM | POA: Insufficient documentation

## 2015-04-20 ENCOUNTER — Telehealth (HOSPITAL_COMMUNITY): Payer: Self-pay | Admitting: MS"

## 2015-04-20 ENCOUNTER — Ambulatory Visit (HOSPITAL_COMMUNITY): Payer: Self-pay | Attending: Family Medicine

## 2015-04-20 ENCOUNTER — Encounter (HOSPITAL_COMMUNITY): Payer: Self-pay

## 2015-04-20 ENCOUNTER — Encounter: Payer: Self-pay | Admitting: *Deleted

## 2015-04-20 VITALS — Ht 61.5 in | Wt 131.6 lb

## 2015-04-20 DIAGNOSIS — O24319 Unspecified pre-existing diabetes mellitus in pregnancy, unspecified trimester: Secondary | ICD-10-CM

## 2015-04-20 NOTE — Telephone Encounter (Signed)
Called patient via Spanish/English interpreter. Patient did not show for genetic counseling today. She stated that she thought that appointment was only if she was planning to have an abortion, and she has decided not to have an abortion, so she went to her diabetic education appointment instead. Stated that is helpful information to know but that it would still be helpful to meet with the patient to discuss other information, particularly since she is planning to continue the pregnancy. Discussed options of rescheduling genetic counseling at any point and that it could also be coordinated with a follow-up ultrasound. Patient stated that Monday mornings work best for her and that she would like to coordinate genetic counseling with an ultrasound visit. We planned to discuss best timing of follow-up with physician and call patient back with follow-up appointment. Patient inquired about Panorama test and if she could pay amount in increments, if she elected to do this test. Discussed that this is likely an option but that we would discuss the pros and cons of this testing option in more detail when she comes for genetic counseling appointment.   Clydie BraunKaren Zita Ozimek 04/20/2015 11:50 AM

## 2015-04-21 ENCOUNTER — Telehealth: Payer: Self-pay | Admitting: General Practice

## 2015-04-21 DIAGNOSIS — O24414 Gestational diabetes mellitus in pregnancy, insulin controlled: Secondary | ICD-10-CM

## 2015-04-21 LAB — PROTEIN, URINE, 24 HOUR
Protein, 24H Urine: 106 mg/d (ref <150)
Protein, Urine: 4 mg/dL — ABNORMAL LOW (ref 5–24)

## 2015-04-21 MED ORDER — INSULIN REGULAR HUMAN 100 UNIT/ML IJ SOLN: 8.0000 [IU] | Freq: Two times a day (BID) | INTRAMUSCULAR | Status: DC

## 2015-04-21 MED ORDER — INSULIN NPH (HUMAN) (ISOPHANE) 100 UNIT/ML ~~LOC~~ SUSP: 8.0000 [IU] | Freq: Two times a day (BID) | SUBCUTANEOUS | Status: AC

## 2015-04-21 NOTE — Telephone Encounter (Signed)
Patient is spanish speaking. Called in to front office and spoke with Alanda AmassBeronica stating she went to her pharmacy to get insulin and it was $300. Patient states she cannot afford that and is out of insulin. Called [facility] and pharmacist states if we change Rx to Novolin instead of Humulin medication will be $25 a vial. Meds refilled. Beronica called patient back and informed her of new prescriptions at pharmacy. Patient verbalizes understanding

## 2015-04-25 ENCOUNTER — Encounter: Payer: Self-pay | Admitting: *Deleted

## 2015-04-25 DIAGNOSIS — O24319 Unspecified pre-existing diabetes mellitus in pregnancy, unspecified trimester: Secondary | ICD-10-CM

## 2015-04-25 DIAGNOSIS — O0992 Supervision of high risk pregnancy, unspecified, second trimester: Secondary | ICD-10-CM

## 2015-04-26 NOTE — Progress Notes (Signed)
  Insulin Instruction  Patient was seen on 04/20/15 for insulin instruction.   Interpretor present. A1c on 04/17/15: 9.5%. She states she had an ultra sound yesterday and she is very worried about her baby.   The following learning objectives were met by the patient during this visit:   Insulin Action of Regular and NPH insulins  Reviewed syringe & vial VS pen including # units per syringe,    length of needles, vial VS Pen cartridge and needles  Hygiene and storage  Drawing up single and mixed doses if using vials   Single dose   Mixed dose:   Rotation of Sites  Hypoglycemia- symptoms, causes , treatment choices  Record keeping and MD follow up  Hypoglycemia, causes, symptoms and treatment   Patient demonstrated understanding of insulin administration by return demonstration.  Patient received the following handouts:  Insulin Instruction Handout with instructions written in Spanish adjacent to Frederic by the Interpretor  Mixing Insulin Brochure by [facility] Getting Started                                        Patient to start on insulin as Rx'd by MD  Patient will be seen for follow-up as needed.

## 2015-05-01 ENCOUNTER — Encounter: Payer: Self-pay | Admitting: Family Medicine

## 2015-05-01 ENCOUNTER — Ambulatory Visit (INDEPENDENT_AMBULATORY_CARE_PROVIDER_SITE_OTHER): Payer: Self-pay | Admitting: Family Medicine

## 2015-05-01 ENCOUNTER — Encounter: Payer: Self-pay | Attending: Family Medicine | Admitting: *Deleted

## 2015-05-01 VITALS — BP 127/80 | HR 96 | Temp 97.8°F | Wt 131.6 lb

## 2015-05-01 DIAGNOSIS — O350XX1 Maternal care for (suspected) central nervous system malformation in fetus, fetus 1: Secondary | ICD-10-CM

## 2015-05-01 DIAGNOSIS — O3505X0 Maternal care for (suspected) central nervous system malformation or damage in fetus, holoprosencephaly, not applicable or unspecified: Secondary | ICD-10-CM | POA: Insufficient documentation

## 2015-05-01 DIAGNOSIS — O24319 Unspecified pre-existing diabetes mellitus in pregnancy, unspecified trimester: Secondary | ICD-10-CM

## 2015-05-01 DIAGNOSIS — Z794 Long term (current) use of insulin: Secondary | ICD-10-CM | POA: Insufficient documentation

## 2015-05-01 DIAGNOSIS — O3505X1 Maternal care for (suspected) central nervous system malformation or damage in fetus, holoprosencephaly, fetus 1: Secondary | ICD-10-CM

## 2015-05-01 DIAGNOSIS — Z3492 Encounter for supervision of normal pregnancy, unspecified, second trimester: Secondary | ICD-10-CM

## 2015-05-01 DIAGNOSIS — O0992 Supervision of high risk pregnancy, unspecified, second trimester: Secondary | ICD-10-CM

## 2015-05-01 DIAGNOSIS — O24919 Unspecified diabetes mellitus in pregnancy, unspecified trimester: Secondary | ICD-10-CM | POA: Insufficient documentation

## 2015-05-01 DIAGNOSIS — O350XX0 Maternal care for (suspected) central nervous system malformation in fetus, not applicable or unspecified: Secondary | ICD-10-CM | POA: Insufficient documentation

## 2015-05-01 DIAGNOSIS — O24912 Unspecified diabetes mellitus in pregnancy, second trimester: Secondary | ICD-10-CM

## 2015-05-01 DIAGNOSIS — Z713 Dietary counseling and surveillance: Secondary | ICD-10-CM | POA: Insufficient documentation

## 2015-05-01 LAB — POCT URINALYSIS DIP (DEVICE)
Bilirubin Urine: NEGATIVE
Glucose, UA: 500 mg/dL — AB
Ketones, ur: 40 mg/dL — AB
Nitrite: NEGATIVE
Protein, ur: NEGATIVE mg/dL
SPECIFIC GRAVITY, URINE: 1.02 (ref 1.005–1.030)
UROBILINOGEN UA: 0.2 mg/dL (ref 0.0–1.0)
pH: 7.5 (ref 5.0–8.0)

## 2015-05-01 MED ORDER — INSULIN REGULAR HUMAN 100 UNIT/ML IJ SOLN: 8.0000 [IU] | Freq: Two times a day (BID) | INTRAMUSCULAR | Status: AC

## 2015-05-01 NOTE — Patient Instructions (Signed)
Segundo trimestre de embarazo (Second Trimester of Pregnancy) El segundo trimestre va desde la semana13 hasta la 28, desde el cuarto hasta el sexto mes, y suele ser el momento en el que mejor se siente. Su organismo se ha adaptado a estar embarazada y comienza a sentirse fsicamente mejor. En general, las nuseas matutinas han disminuido o han desaparecido completamente, p El segundo trimestre es tambin la poca en la que el feto se desarrolla rpidamente. Hacia el final del sexto mes, el feto mide aproximadamente 9pulgadas (23cm) y pesa alrededor de 1 libras (700g). Es probable que sienta que el beb se mueve (da pataditas) entre las 18 y 20semanas del embarazo. CAMBIOS EN EL ORGANISMO Su organismo atraviesa por muchos cambios durante el embarazo, y estos varan de una mujer a otra.   Seguir aumentando de peso. Notar que la parte baja del abdomen sobresale.  Podrn aparecer las primeras estras en las caderas, el abdomen y las mamas.  Es posible que tenga dolores de cabeza que pueden aliviarse con los medicamentos que su mdico autorice.  Tal vez tenga necesidad de orinar con ms frecuencia porque el feto est ejerciendo presin sobre la vejiga.  Debido al embarazo podr sentir acidez estomacal con frecuencia.  Puede estar estreida, ya que ciertas hormonas enlentecen los movimientos de los msculos que empujan los desechos a travs de los intestinos.  Pueden aparecer hemorroides o abultarse e hincharse las venas (venas varicosas).  Puede tener dolor de espalda que se debe al aumento de peso y a que las hormonas del embarazo relajan las articulaciones entre los huesos de la pelvis, y como consecuencia de la modificacin del peso y los msculos que mantienen el equilibrio.  Las mamas seguirn creciendo y le dolern.  Las encas pueden sangrar y estar sensibles al cepillado y al hilo dental.  Pueden aparecer zonas oscuras o manchas (cloasma, mscara del embarazo) en el rostro que  probablemente se atenuarn despus del nacimiento del beb.  Es posible que se forme una lnea oscura desde el ombligo hasta la zona del pubis (linea nigra) que probablemente se atenuarn despus del nacimiento del beb.  Tal vez haya cambios en el cabello que pueden incluir su engrosamiento, crecimiento rpido y cambios en la textura. Adems, a algunas mujeres se les cae el cabello durante o despus del embarazo, o tienen el cabello seco o fino. Lo ms probable es que el cabello se le normalice despus del nacimiento del beb. QU DEBE ESPERAR EN LAS CONSULTAS PRENATALES Durante una visita prenatal de rutina:  La pesarn para asegurarse de que usted y el feto estn creciendo normalmente.  Le tomarn la presin arterial.  Le medirn el abdomen para controlar el desarrollo del beb.  Se escucharn los latidos cardacos fetales.  Se evaluarn los resultados de los estudios solicitados en visitas anteriores. El mdico puede preguntarle lo siguiente:  Cmo se siente.  Si siente los movimientos del beb.  Si ha tenido sntomas anormales, como prdida de lquido, sangrado, dolores de cabeza intensos o clicos abdominales.  Si tiene alguna pregunta. Otros estudios que podrn realizarse durante el segundo trimestre incluyen lo siguiente:  Anlisis de sangre para detectar:  Concentraciones de hierro bajas (anemia).  Diabetes gestacional (entre la semana 24 y la 28).  Anticuerpos Rh.  Anlisis de orina para detectar infecciones, diabetes o protenas en la orina.  Una ecografa para confirmar que el beb crece y se desarrolla correctamente.  Una amniocentesis para diagnosticar posibles problemas genticos.  Estudios del feto para descartar espina   bfida y sndrome de Down. INSTRUCCIONES PARA EL CUIDADO EN EL HOGAR   Evite fumar, consumir hierbas, beber alcohol y tomar frmacos que no le hayan recetado. Estas sustancias qumicas afectan la formacin y el desarrollo del beb.  Siga  las indicaciones del mdico en relacin con el uso de medicamentos. Durante el embarazo, hay medicamentos que son seguros de tomar y otros que no.  Haga actividad fsica solo en la forma indicada por el mdico. Sentir clicos uterinos es un buen signo para detener la actividad fsica.  Contine comiendo alimentos que sanos con regularidad.  Use un sostn que le brinde buen soporte si le duelen las mamas.  No se d baos de inmersin en agua caliente, baos turcos ni saunas.  Colquese el cinturn de seguridad cuando conduzca.  No coma carne cruda ni queso sin cocinar; evite el contacto con las bandejas sanitarias de los gatos y la tierra que estos animales usan. Estos elementos contienen grmenes que pueden causar defectos congnitos en el beb.  Tome las vitaminas prenatales.  Si est estreida, pruebe un laxante suave (si el mdico lo autoriza). Consuma ms alimentos ricos en fibra, como vegetales y frutas frescos y cereales integrales. Beba gran cantidad de lquido para mantener la orina de tono claro o color amarillo plido.  Dese baos de asiento con agua tibia para aliviar el dolor o las molestias causadas por las hemorroides. Use una crema para las hemorroides si el mdico la autoriza.  Si tiene venas varicosas, use medias de descanso. Eleve los pies durante 15minutos, 3 o 4veces por da. Limite la cantidad de sal en su dieta.  No levante objetos pesados, use zapatos de tacones bajos y mantenga una buena postura.  Descanse con las piernas elevadas si tiene calambres o dolor de cintura.  Visite a su dentista si an no lo ha hecho durante el embarazo. Use un cepillo de dientes blando para higienizarse los dientes y psese el hilo dental con suavidad.  Puede seguir manteniendo relaciones sexuales, a menos que el mdico le indique lo contrario.  Concurra a todas las visitas prenatales segn las indicaciones de su mdico. SOLICITE ATENCIN MDICA SI:   Tiene mareos.  Siente  clicos leves, presin en la pelvis o dolor persistente en el abdomen.  Tiene nuseas, vmitos o diarrea persistentes.  Tiene secrecin vaginal con mal olor.  Siente dolor al orinar. SOLICITE ATENCIN MDICA DE INMEDIATO SI:   Tiene fiebre.  Tiene una prdida de lquido por la vagina.  Tiene sangrado o pequeas prdidas vaginales.  Siente dolor intenso o clicos en el abdomen.  Sube o baja de peso rpidamente.  Tiene dificultad para respirar y siente dolor de pecho.  Sbitamente se le hinchan mucho el rostro, las manos, los tobillos, los pies o las piernas.  No ha sentido los movimientos del beb durante una hora.  Siente un dolor de cabeza intenso que no se alivia con medicamentos.  Hay cambios en la visin. Document Released: 07/17/2005 Document Revised: 10/12/2013 Exit[employer] Patient Information 2015 [employer]. This information is not intended to replace advice given to you by your health care provider. Make sure you discuss any questions you have with your health care provider.  

## 2015-05-01 NOTE — Progress Notes (Signed)
Verified appointments with MFM for ultrasound and genetic counseling. Patient states unaware of appointments. They are scheduled for May 22, 2015 @ 9:00AM and 10:00AM Interpreter used to inform of appointment dates and times. Verified understanding.

## 2015-05-01 NOTE — Progress Notes (Signed)
Patient presents with Dr. Shawnie PonsPratt due to insulin concerns. Patients has not eaten breakfast this morning. She has not tested her glucose nor does she have her log book with her. She states she has run out of supplies yet she has two strips in Her canister. This implies she was able to test but didn't. She recalls that she is supposed to test 3 times daily. She has not started her insulin due to cost. Dr. Shawn[facility]Pratt [facility] However the [facility] is not open yet and I will have to call again after 9:00am. FBS performed 251mg /dl. [id number]: spoke with pharmacist @ [facility] and confirmed that we have the correct order in place for the patient. However it has been put back into stock because the patient did not pick it up. As for syringes they can be obtained with a prescription. Per Dr. Shawnie PonsPratt patient is to discontinue Metformin and glyburide. I see see patient in 1 week for review of glucose readings. I emphasized the importance of eating regularly as instructed by Vernona RiegerLaura, testing 4 times daily and logging in book and taking insulin. Through the use of interpreter [place], patient verbalized understanding.

## 2015-05-01 NOTE — Progress Notes (Signed)
Spanish Interpreter Maretta LosBlanca Lindner Breastfeeding tip of the week reviewed Pt is not currently taking insulin due to cost Needs to see Diabetes/Social worker today Pt does not have glucose log book today, educated that it is important to bring to appointments for evaluation by the provider Ketones: 40, Hgb: Moderate, leukocytes: small

## 2015-05-01 NOTE — Progress Notes (Signed)
Subjective:  Joy Wallace is a 34 y.o. [id number] at 5361w6d being seen today for ongoing prenatal care.  Patient reports no complaints. 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 She desires to keep the pregnancy.  She has not started her insulin due to cost. Contractions: Not present.  Vag. Bleeding: None. Movement: Present. Denies leaking of fluid.   The following portions of the patient's history were reviewed and updated as appropriate: allergies, current medications, past family history, past medical history, past social history, past surgical history and problem list.   Objective:   Filed Vitals:   05/01/15 0757  BP: 127/80  Pulse: 96  Temp: 97.8 F (36.6 C)  Weight: 131 lb 9.6 oz (59.693 kg)    Fetal Status: Fetal Heart Rate (bpm): 150   Movement: Present     General:  Alert, oriented and cooperative. Patient is in no acute distress.  Skin: Skin is warm and dry. No rash noted.   Cardiovascular: Normal heart rate noted  Respiratory: Normal respiratory effort, no problems with respiration noted  Abdomen: Soft, gravid, appropriate for gestational age. Pain/Pressure: Absent     Vaginal: Vag. Bleeding: None.       Extremities: Normal range of motion.  Edema: None  Mental Status: Normal mood and affect. Normal behavior. Normal judgment and thought content.   Urinalysis: Urine Protein: Negative Urine Glucose: 3+  Assessment and Plan:  Pregnancy: G5P4004 at 4061w6d  1. Prenatal care in second trimester  - Prenatal Profile   3. Pre-existing diabetes mellitus affecting pregnancy, antepartum  - insulin regular (NOVOLIN R) 100 units/mL injection; Inject 0.08-0.1 mLs (8-10 Units total) into the skin 2 (two) times daily before a meal. 10 u q am and 8 u q ac dinner (evening meal)  Dispense: 10 mL; Refill: 11  4. Holoprosencephaly, fetal, affecting care of mother, antepartum, fetus 1  - AMB referral to maternal fetal medicine   Preterm labor  symptoms and general obstetric precautions including but not limited to vaginal bleeding, contractions, leaking of fluid and fetal movement were reviewed in detail with the patient.  Please refer to After Visit Summary for other counseling recommendations.   Return in 2 weeks (on 05/15/2015).   Reva Boresanya S Frenchie Pribyl, MD

## 2015-05-02 LAB — PRENATAL PROFILE (SOLSTAS)
Antibody Screen: NEGATIVE
BASOS PCT: 0 % (ref 0–1)
Basophils Absolute: 0 10*3/uL (ref 0.0–0.1)
Eosinophils Absolute: 0.1 10*3/uL (ref 0.0–0.7)
Eosinophils Relative: 1 % (ref 0–5)
HEMATOCRIT: 38.7 % (ref 36.0–46.0)
HEMOGLOBIN: 12.9 g/dL (ref 12.0–15.0)
HEP B S AG: NEGATIVE
HIV: NONREACTIVE
Lymphocytes Relative: 23 % (ref 12–46)
Lymphs Abs: 2.1 10*3/uL (ref 0.7–4.0)
MCH: 29.5 pg (ref 26.0–34.0)
MCHC: 33.3 g/dL (ref 30.0–36.0)
MCV: 88.4 fL (ref 78.0–100.0)
MPV: 9.2 fL (ref 8.6–12.4)
Monocytes Absolute: 0.4 10*3/uL (ref 0.1–1.0)
Monocytes Relative: 4 % (ref 3–12)
Neutro Abs: 6.7 10*3/uL (ref 1.7–7.7)
Neutrophils Relative %: 72 % (ref 43–77)
Platelets: 308 10*3/uL (ref 150–400)
RBC: 4.38 MIL/uL (ref 3.87–5.11)
RDW: 13.6 % (ref 11.5–15.5)
Rh Type: POSITIVE
Rubella: 1.73 Index — ABNORMAL HIGH (ref <0.90)
WBC: 9.3 10*3/uL (ref 4.0–10.5)

## 2015-05-08 ENCOUNTER — Ambulatory Visit: Payer: Self-pay | Admitting: *Deleted

## 2015-05-08 ENCOUNTER — Encounter: Payer: Self-pay | Admitting: *Deleted

## 2015-05-08 DIAGNOSIS — O24419 Gestational diabetes mellitus in pregnancy, unspecified control: Secondary | ICD-10-CM

## 2015-05-08 NOTE — Progress Notes (Signed)
Patient presents today for review of glucose readings and assessment of insulin administration. She is administering insulin properly. However, she is not eating regularly.We reviewed nutrition recommendations with assistance of Elna BreslowCarol Hernandez Spanish Interpreter. FBS range 139-183mg /dl, 2hpp range [id number]$[id number] /dl. In discussion with Dr. Jolayne Pantheronstant we will increase NPH from 20 to 24units  with breakfast and increase NPH from 8-12units at bed time. Patient has an appointment 7/25 with provider. We will review readings and possible insulin adjustment at that time.

## 2015-05-15 ENCOUNTER — Ambulatory Visit (INDEPENDENT_AMBULATORY_CARE_PROVIDER_SITE_OTHER): Payer: Self-pay | Admitting: Obstetrics and Gynecology

## 2015-05-15 ENCOUNTER — Encounter: Payer: Self-pay | Admitting: *Deleted

## 2015-05-15 VITALS — BP 114/77 | HR 90 | Temp 97.9°F | Wt 136.3 lb

## 2015-05-15 DIAGNOSIS — O350XX1 Maternal care for (suspected) central nervous system malformation in fetus, fetus 1: Secondary | ICD-10-CM

## 2015-05-15 DIAGNOSIS — O3505X1 Maternal care for (suspected) central nervous system malformation or damage in fetus, holoprosencephaly, fetus 1: Secondary | ICD-10-CM

## 2015-05-15 DIAGNOSIS — O24919 Unspecified diabetes mellitus in pregnancy, unspecified trimester: Secondary | ICD-10-CM

## 2015-05-15 DIAGNOSIS — O24319 Unspecified pre-existing diabetes mellitus in pregnancy, unspecified trimester: Secondary | ICD-10-CM

## 2015-05-15 DIAGNOSIS — N390 Urinary tract infection, site not specified: Secondary | ICD-10-CM

## 2015-05-15 DIAGNOSIS — E119 Type 2 diabetes mellitus without complications: Secondary | ICD-10-CM

## 2015-05-15 DIAGNOSIS — Z3492 Encounter for supervision of normal pregnancy, unspecified, second trimester: Secondary | ICD-10-CM

## 2015-05-15 LAB — POCT URINALYSIS DIP (DEVICE)
BILIRUBIN URINE: NEGATIVE
Glucose, UA: 100 mg/dL — AB
KETONES UR: NEGATIVE mg/dL
NITRITE: NEGATIVE
Protein, ur: NEGATIVE mg/dL
Specific Gravity, Urine: 1.02 (ref 1.005–1.030)
UROBILINOGEN UA: 2 mg/dL — AB (ref 0.0–1.0)
pH: 7.5 (ref 5.0–8.0)

## 2015-05-15 MED ORDER — CEPHALEXIN 500 MG PO CAPS: 500.0000 mg | ORAL_CAPSULE | Freq: Four times a day (QID) | ORAL | Status: DC

## 2015-05-15 NOTE — Progress Notes (Addendum)
Reviewed glucose readings FBS 100-143, 2hpp 130-231. Will increase NPH from 24 to 28u AM 10units to 14 units in pm. R will remain the same at 10units with breakfast and dinner. Patient has appt in [facility] for U/S and genetic counseling 05/22/15. Spoke with Arts administrator in [facility]. Discussed glucose readings. Requested MD review glucose readings and adjust as warranted.

## 2015-05-15 NOTE — Progress Notes (Signed)
Used Interpreter Darel Hong. Moderate hemoglobin noted in urinalysis.

## 2015-05-15 NOTE — Progress Notes (Signed)
Subjective:  Joy Wallace is a 34 y.o. [id number] at [redacted]w[redacted]d being seen today for ongoing prenatal care.  Patient reports dysuria x 3-4 days.  Contractions: Not present.  Vag. Bleeding: None. Movement: Present. Denies leaking of fluid.  Significant [place] abnormalities> did not see genetic counselor because she decided not to have termination.   Since starting insulin 05/02/15, taking AM N24/R10; dinner R10; hs N10: F all elevated100-183, ppb: 180-257; ppl 160-231; ppd: 130-277  The following portions of the patient's history were reviewed and updated as appropriate: allergies, current medications, past family history, past medical history, past social history, past surgical history and problem list.    Objective:   Filed Vitals:   05/15/15 1019  BP: 114/77  Pulse: 90  Temp: 97.9 F (36.6 C)  Weight: 136 lb 4.8 oz (61.825 kg)    Fetal Status: Fetal Heart Rate (bpm): 145   Movement: Present     General:  Alert, oriented and cooperative. Patient is in no acute distress.  Skin: Skin is warm and dry. No rash noted.   Cardiovascular: Normal heart rate noted  Respiratory: Normal respiratory effort, no problems with respiration noted  Abdomen: Soft, gravid, appropriate for gestational age. Pain/Pressure: Absent     Vaginal: Vag. Bleeding: None.       Cervix: Not evaluated        Extremities: Normal range of motion.  Edema: None  Mental Status: Normal mood and affect. Normal behavior. Normal judgment and thought content.  Neg CVAT Urinalysis: Urine Protein: Negative Urine Glucose: 1+ Mod hgb, tr LE>C&S sent  Assessment and Plan:  Pregnancy: G5P4004 at [redacted]w[redacted]d 1. Type 2 diabetes mellitus without complication   2. Prenatal care in second trimester   3. UTI (lower urinary tract infection)   4. Pre-existing diabetes mellitus affecting pregnancy, antepartum   5. Holoprosencephaly, fetal, affecting care of mother, antepartum, fetus 1    Insulin adjustment and see DM Counselor Preterm  labor symptoms and general obstetric precautions including but not limited to vaginal bleeding, contractions, leaking of fluid and fetal movement were reviewed in detail with the patient. Please refer to After Visit Summary for other counseling recommendations.  MFM [place] and genetic counseling in 1 wk Return in about 1 week (around 05/22/2015).   Danae Orleans, CNM

## 2015-05-15 NOTE — Patient Instructions (Signed)
Pregnancy and Urinary Tract Infection °A urinary tract infection (UTI) is a bacterial infection of the urinary tract. Infection of the urinary tract can include the ureters, kidneys (pyelonephritis), bladder (cystitis), and urethra (urethritis). All pregnant women should be screened for bacteria in the urinary tract. Identifying and treating a UTI will decrease the risk of preterm labor and developing more serious infections in both the mother and baby. °CAUSES °Bacteria germs cause almost all UTIs.  °RISK FACTORS °Many factors can increase your chances of getting a UTI during pregnancy. These include: °· Having a short urethra. °· Poor toilet and hygiene habits. °· Sexual intercourse. °· Blockage of urine along the urinary tract. °· Problems with the pelvic muscles or nerves. °· Diabetes. °· Obesity. °· Bladder problems after having several children. °· Previous history of UTI. °SIGNS AND SYMPTOMS  °· Pain, burning, or a stinging feeling when urinating. °· Suddenly feeling the need to urinate right away (urgency). °· Loss of bladder control (urinary incontinence). °· Frequent urination, more than is common with pregnancy. °· Lower abdominal or back discomfort. °· Cloudy urine. °· Blood in the urine (hematuria). °· Fever.  °When the kidneys are infected, the symptoms may be: °· Back pain. °· Flank pain on the right side more so than the left. °· Fever. °· Chills. °· Nausea. °· Vomiting. °DIAGNOSIS  °A urinary tract infection is usually diagnosed through urine tests. Additional tests and procedures are sometimes done. These may include: °· Ultrasound exam of the kidneys, ureters, bladder, and urethra. °· Looking in the bladder with a lighted tube (cystoscopy). °TREATMENT °Typically, UTIs can be treated with antibiotic medicines.  °HOME CARE INSTRUCTIONS  °· Only take over-the-counter or prescription medicines as directed by your health care provider. If you were prescribed antibiotics, take them as directed. Finish  them even if you start to feel better. °· Drink enough fluids to keep your urine clear or pale yellow. °· Do not have sexual intercourse until the infection is gone and your health care provider says it is okay. °· Make sure you are tested for UTIs throughout your pregnancy. These infections often come back.  °Preventing a UTI in the Future °· Practice good toilet habits. Always wipe from front to back. Use the tissue only once. °· Do not hold your urine. Empty your bladder as soon as possible when the urge comes. °· Do not douche or use deodorant sprays. °· Wash with soap and warm water around the genital area and the anus. °· Empty your bladder before and after sexual intercourse. °· Wear underwear with a cotton crotch. °· Avoid caffeine and carbonated drinks. They can irritate the bladder. °· Drink cranberry juice or take cranberry pills. This may decrease the risk of getting a UTI. °· Do not drink alcohol. °· Keep all your appointments and tests as scheduled.  °SEEK MEDICAL CARE IF:  °· Your symptoms get worse. °· You are still having fevers 2 or more days after treatment begins. °· You have a rash. °· You feel that you are having problems with medicines prescribed. °· You have abnormal vaginal discharge. °SEEK IMMEDIATE MEDICAL CARE IF:  °· You have back or flank pain. °· You have chills. °· You have blood in your urine. °· You have nausea and vomiting. °· You have contractions of your uterus. °· You have a gush of fluid from the vagina. °MAKE SURE YOU: °· Understand these instructions.   °· Will watch your condition.   °· Will get help right away if you are not doing   well or get worse.   °Document Released: 02/01/2011 Document Revised: 07/28/2013 Document Reviewed: 05/06/2013 °ExitCare® Patient Information ©2015 [employer]. This information is not intended to replace advice given to you by your health care provider. Make sure you discuss any questions you have with your health care provider. ° °

## 2015-05-16 ENCOUNTER — Encounter (HOSPITAL_COMMUNITY): Payer: Self-pay | Admitting: Family Medicine

## 2015-05-17 ENCOUNTER — Other Ambulatory Visit (HOSPITAL_COMMUNITY): Payer: Self-pay | Admitting: Maternal and Fetal Medicine

## 2015-05-17 DIAGNOSIS — O3505X0 Maternal care for (suspected) central nervous system malformation or damage in fetus, holoprosencephaly, not applicable or unspecified: Secondary | ICD-10-CM

## 2015-05-17 DIAGNOSIS — O24312 Unspecified pre-existing diabetes mellitus in pregnancy, second trimester: Secondary | ICD-10-CM

## 2015-05-17 DIAGNOSIS — O350XX0 Maternal care for (suspected) central nervous system malformation in fetus, not applicable or unspecified: Secondary | ICD-10-CM

## 2015-05-22 ENCOUNTER — Encounter: Payer: Self-pay | Admitting: Obstetrics & Gynecology

## 2015-05-22 ENCOUNTER — Encounter (HOSPITAL_COMMUNITY): Payer: Self-pay

## 2015-05-22 ENCOUNTER — Other Ambulatory Visit (HOSPITAL_COMMUNITY): Payer: Self-pay | Admitting: Maternal and Fetal Medicine

## 2015-05-22 ENCOUNTER — Ambulatory Visit (HOSPITAL_COMMUNITY)
Admission: RE | Admit: 2015-05-22 | Discharge: 2015-05-22 | Disposition: A | Discharge status: Home / Self Care | Payer: Self-pay | Source: Ambulatory Visit | Attending: Family Medicine | Admitting: Family Medicine

## 2015-05-22 ENCOUNTER — Ambulatory Visit (INDEPENDENT_AMBULATORY_CARE_PROVIDER_SITE_OTHER): Payer: Self-pay | Admitting: Obstetrics & Gynecology

## 2015-05-22 VITALS — BP 108/70 | HR 92 | Temp 98.3°F | Wt 138.4 lb

## 2015-05-22 DIAGNOSIS — E119 Type 2 diabetes mellitus without complications: Secondary | ICD-10-CM | POA: Insufficient documentation

## 2015-05-22 DIAGNOSIS — O350XX1 Maternal care for (suspected) central nervous system malformation in fetus, fetus 1: Secondary | ICD-10-CM

## 2015-05-22 DIAGNOSIS — Z315 Encounter for genetic counseling: Secondary | ICD-10-CM | POA: Insufficient documentation

## 2015-05-22 DIAGNOSIS — O24312 Unspecified pre-existing diabetes mellitus in pregnancy, second trimester: Secondary | ICD-10-CM | POA: Insufficient documentation

## 2015-05-22 DIAGNOSIS — O350XX0 Maternal care for (suspected) central nervous system malformation in fetus, not applicable or unspecified: Secondary | ICD-10-CM

## 2015-05-22 DIAGNOSIS — Z758 Other problems related to medical facilities and other health care: Secondary | ICD-10-CM

## 2015-05-22 DIAGNOSIS — O3508X0 Maternal care for (suspected) central nervous system malformation or damage in fetus, spina bifida, not applicable or unspecified: Secondary | ICD-10-CM

## 2015-05-22 DIAGNOSIS — O24319 Unspecified pre-existing diabetes mellitus in pregnancy, unspecified trimester: Secondary | ICD-10-CM

## 2015-05-22 DIAGNOSIS — O3505X1 Maternal care for (suspected) central nervous system malformation or damage in fetus, holoprosencephaly, fetus 1: Secondary | ICD-10-CM

## 2015-05-22 DIAGNOSIS — O0992 Supervision of high risk pregnancy, unspecified, second trimester: Secondary | ICD-10-CM

## 2015-05-22 DIAGNOSIS — O283 Abnormal ultrasonic finding on antenatal screening of mother: Secondary | ICD-10-CM | POA: Insufficient documentation

## 2015-05-22 DIAGNOSIS — Z3A23 23 weeks gestation of pregnancy: Secondary | ICD-10-CM | POA: Insufficient documentation

## 2015-05-22 DIAGNOSIS — Z789 Other specified health status: Secondary | ICD-10-CM

## 2015-05-22 DIAGNOSIS — Z3A27 27 weeks gestation of pregnancy: Secondary | ICD-10-CM | POA: Insufficient documentation

## 2015-05-22 DIAGNOSIS — O3505X0 Maternal care for (suspected) central nervous system malformation or damage in fetus, holoprosencephaly, not applicable or unspecified: Secondary | ICD-10-CM

## 2015-05-22 DIAGNOSIS — O24912 Unspecified diabetes mellitus in pregnancy, second trimester: Secondary | ICD-10-CM

## 2015-05-22 LAB — POCT URINALYSIS DIP (DEVICE)
Bilirubin Urine: NEGATIVE
GLUCOSE, UA: 100 mg/dL — AB
Hgb urine dipstick: NEGATIVE
KETONES UR: NEGATIVE mg/dL
Leukocytes, UA: NEGATIVE
NITRITE: NEGATIVE
PROTEIN: NEGATIVE mg/dL
Specific Gravity, Urine: 1.015 (ref 1.005–1.030)
Urobilinogen, UA: 1 mg/dL (ref 0.0–1.0)
pH: 7.5 (ref 5.0–8.0)

## 2015-05-22 NOTE — Progress Notes (Signed)
Interpreter Elna Breslow Educated pt on [employer]

## 2015-05-22 NOTE — Progress Notes (Signed)
Subjective:  Joy Wallace is a 34 y.o. MH [id number] at [redacted]w[redacted]d being seen today for ongoing prenatal care.  Patient reports no complaints.  Contractions: Not present.  Vag. Bleeding: None. Movement: Present. Denies leaking of fluid.   The following portions of the patient's history were reviewed and updated as appropriate: allergies, current medications, past family history, past medical history, past social history, past surgical history and problem list.   Objective:   Filed Vitals:   05/22/15 0842  BP: 108/70  Pulse: 92  Temp: 98.3 F (36.8 C)  Weight: 138 lb 6.4 oz (62.778 kg)    Fetal Status: Fetal Heart Rate (bpm): 144   Movement: Present     General:  Alert, oriented and cooperative. Patient is in no acute distress.  Skin: Skin is warm and dry. No rash noted.   Cardiovascular: Normal heart rate noted  Respiratory: Normal respiratory effort, no problems with respiration noted  Abdomen: Soft, gravid, appropriate for gestational age. Pain/Pressure: Absent     Vaginal: Vag. Bleeding: None.       Cervix: Not evaluated        Extremities: Normal range of motion.  Edema: None  Mental Status: Normal mood and affect. Normal behavior. Normal judgment and thought content.   Urinalysis: Urine Protein: Negative Urine Glucose: 1+  Assessment and Plan:  Pregnancy: G5P4004 at [redacted]w[redacted]d  1. Language barrier affecting health care Interpretor present  2. Pre-existing diabetes mellitus affecting pregnancy, antepartum Her fastings are reasonable but her 2 hours are generally in the 150-200 range. I have advised her to increase her AM NPH from 28 to 32.  3. Holoprosencephaly, fetal, affecting care of mother, antepartum, fetus 1 She has an MFM and genetic counselor appt today at 10 am.  4. Supervision of high risk pregnancy, antepartum, second trimester   Preterm labor symptoms and general obstetric precautions including but not limited to vaginal bleeding, contractions, leaking of  fluid and fetal movement were reviewed in detail with the patient. Please refer to After Visit Summary for other counseling recommendations.   No Follow-up on file.   Allie Bossier, MD

## 2015-05-22 NOTE — Progress Notes (Signed)
Genetic Counseling  High-Risk Gestation Note  Appointment Date:  05/22/2015 Referred By: Joy Jude, MD Date of Birth:  [birth year] Partner:  Joy Wallace   Pregnancy History: [id number] Estimated Date of Delivery: 09/12/15 Estimated Gestational Age: 78w6dAttending: PBenjaman Lobe MD   I met with Ms. Joy Sambranofor genetic counseling because of abnormal ultrasound findings. Spanish/English medical interpreter present for today's visit.   In Summary:  Ultrasound findings of holoprosencephaly, microcephaly, abnormal left orbit, sacral myelomeningocele  Patient has history of diabetes mellitus  Declined amniocentesis and NIPS today (due to cost of NIPS) but may further consider NIPS after talking more with her husband  Follow-up scheduled for 06/19/15  We began by reviewing the ultrasound in detail. Ms. 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 Complete ultrasound results reported separately. Follow-up ultrasound performed today also visualized these findings, and additionally myelomeningocele of the sacral spine was visualized today. Complete ultrasound report under separate cover.   We discussed that holoprosencephaly (HPE) is a structural anomaly of the brain in which there is failed or incomplete separation of the forebrain during the third to fourth week of pregnancy. Classic HPE encompasses a continuum of brain malformations including: alobar (no separation of the cerebral hemispheres), semilobar (left and right frontal and parietal lobes are fused and the interhemispheric fissure is only present posteriorly), and lobar (most of the cerebral hemispheres are separated but the most rostral aspect remains fused).  HPE is accompanied by a spectrum of characteristic craniofacial anomalies in approximately  80% of individuals with HPE, including cyclopia or ocular hypotelorism, bilateral cleft lip/palate, and abnormal nose or proboscis.  We discussed that most children with HPE have a variety of health problems which may include: feeding difficulties, seizures, intellectual disability/developmental delay, pituitary dysfunction, short stature, sleep disorders, and aspiration pneumonia.  We discussed that the prognosis for the child depends largely upon the underlying etiology and the severity of the HPE.  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  The additional head findings, including microcephaly and abnormal orbit are likely related to the underlying holoprosencephaly.  We discussed that it is difficult to fully predict the lifespan of the child, but semi-lobar holoprosencephaly is less likely to significantly limit the lifespan as compared to the more severe alobar holoprosencephaly. We also discussed the specific associated medical issues would not be able to be predicted.    We discussed that the additional finding of sacral myelomeningocele would add to the medical needs for the baby. We discussed that spina bifida is one of the most common birth differences and affects approximately 1 in 600 babies each year [place]  We reviewed that the spine contains nerves that control the legs, bladder, and bowel.  If there is an opening or a change in the development of the spine, the nerves can be damaged or incompletely formed.  We also discussed that babies with spina bifida can have a range of health concerns, which partially depends on where the opening is located along the spine.  Typically, openings in the lower spine cause fewer health concerns than openings in the upper spine.  Individuals with spina bifida may have muscle weakness, some degree of paralysis in the lower limbs, difficulty controlling bladder and bowel function, fluid  build-up in the ventricles of the brain (ventriculomegaly), and  learning differences.  The degree of paralysis, possible learning problems, and related health concerns that a baby with spina bifida may have cannot be accurately predicted until after birth.    We discussed that in most cases, surgery to close the opening in the spine is performed within the first 24 to 48 hours after birth.  Immediate surgery is required to prevent infection and any further damage to the nerves that are exposed.  We discussed the option with the patient. Additionally, she has the option of meeting with a pediatric neurosurgeon to discuss and coordinate any necessary surgeries following the delivery.   We also discussed the various causes for the observed findings including: an environmental (alcohol and maternal diabetes mellitus), multifactorial, or genetic etiology. Ms. Joy Wallace has diabetes mellitus, and her hemoglobin A1C was reported to be 9.6 on 04/17/15. We that maternal diabetes is the most common known teratogen to cause holoprosencephaly, and that infants of diabetic mothers have an approximate 1% (approximately 200-fold increase) risk for HPE. NTDs have also been seen to be caused by teratogens, such as maternal diabetes.  Regarding genetic etiologies, we discussed that these can be further subdivided into syndromic and nonsyndromic categories.  Syndromic causes include many single gene and chromosomal aberrations.  The single gene category includes both dominant (Pallister-Hall) and recessive conditions (Meckel syndrome and SLOS), while the chromosome differences include a variety of deletions, duplications, trisomies, and triploidy.  We reviewed that the nonsyndromic single gene causes of HPE are mostly inherited in an autosomal dominant fashion.  More than 12 genes are currently known to cause nonsyndromic HPE.  We discussed that these gene mutations are associated with phenotypic variability.  They were  counseled that the risk of recurrence depends upon the underlying etiology.     We discussed that approximately 25-50% of fetuses with HPE have a chromosome difference (specifically trisomy 18).  She was offered the option of amniocentesis and cell free DNA testing (noninvasive prenatal screening). We discussed the availability of both prenatal and postnatal single gene testing.  We discussed the option of a postnatal evaluation by a medical geneticist to determine whether specific gene testing for either syndromic or nonsyndromic forms of HPE should be ordered.   We reviewed the benefits, risks, and limitations of amniocentesis, including the associated 1 in 097-353 risk for complications. We reviewed the benefits and limitations of NIPS (noninvasive prenatal screening), including that it is not diagnostic and does not assess for all chromosome conditions. Ms. Joy Wallace declined amniocentesis. She declined NIPS at today's visit given the cost of the testing, but she planned to further discuss the option of NIPS with her husband and may pursue this testing later.   Both family histories were reviewed and found to be noncontributory for birth defects, intellectual disability, and known genetic conditions. Without further information regarding the provided family history, an accurate genetic risk cannot be calculated. Further genetic counseling is warranted if more information is obtained.  Ms. Joy Wallace denied exposure to environmental toxins or chemical agents. She denied the use of alcohol, tobacco or street drugs. She denied significant viral illnesses during the course of her pregnancy. Her medical and surgical histories were contributory for diabetes mellitus, as previously discussed.    I counseled Ms. Joy Wallace regarding the above risks and available options.  The approximate face-to-face time with the genetic counselor was 40 minutes.  Joy Oman, MS Certified  Genetic Counselor 05/22/2015

## 2015-05-29 ENCOUNTER — Encounter: Payer: Self-pay | Admitting: Obstetrics and Gynecology

## 2015-05-29 ENCOUNTER — Other Ambulatory Visit: Payer: Self-pay | Admitting: General Practice

## 2015-06-05 ENCOUNTER — Ambulatory Visit (INDEPENDENT_AMBULATORY_CARE_PROVIDER_SITE_OTHER): Payer: Self-pay | Admitting: Obstetrics and Gynecology

## 2015-06-05 ENCOUNTER — Encounter: Payer: Self-pay | Admitting: *Deleted

## 2015-06-05 VITALS — BP 124/78 | HR 94 | Temp 98.2°F | Wt 141.8 lb

## 2015-06-05 DIAGNOSIS — IMO0002 Reserved for concepts with insufficient information to code with codable children: Secondary | ICD-10-CM | POA: Insufficient documentation

## 2015-06-05 DIAGNOSIS — O24912 Unspecified diabetes mellitus in pregnancy, second trimester: Secondary | ICD-10-CM

## 2015-06-05 DIAGNOSIS — R896 Abnormal cytological findings in specimens from other organs, systems and tissues: Secondary | ICD-10-CM

## 2015-06-05 DIAGNOSIS — O24419 Gestational diabetes mellitus in pregnancy, unspecified control: Secondary | ICD-10-CM

## 2015-06-05 DIAGNOSIS — O24319 Unspecified pre-existing diabetes mellitus in pregnancy, unspecified trimester: Secondary | ICD-10-CM

## 2015-06-05 LAB — POCT URINALYSIS DIP (DEVICE)
Bilirubin Urine: NEGATIVE
Glucose, UA: 500 mg/dL — AB
HGB URINE DIPSTICK: NEGATIVE
KETONES UR: 15 mg/dL — AB
Nitrite: NEGATIVE
Protein, ur: NEGATIVE mg/dL
SPECIFIC GRAVITY, URINE: 1.02 (ref 1.005–1.030)
Urobilinogen, UA: 1 mg/dL (ref 0.0–1.0)
pH: 7 (ref 5.0–8.0)

## 2015-06-05 LAB — HEMOGLOBIN A1C
Hgb A1c MFr Bld: 8.1 % — ABNORMAL HIGH (ref <5.7)
Mean Plasma Glucose: 186 mg/dL — ABNORMAL HIGH (ref <117)

## 2015-06-05 NOTE — Progress Notes (Signed)
Used interpreter MariaElena Jiminez. 

## 2015-06-05 NOTE — Progress Notes (Signed)
Subjective:  Joy Wallace is a 34 y.o. [id number] at 69w6dbeing seen today for ongoing prenatal care.  Patient reports no complaints. AM fastings 4/7 > 95. 2-hour PPs 1/2 greater than 130. Contractions: Not present.  Vag. Bleeding: None. Movement: Present. Denies leaking of fluid.   The following portions of the patient's history were reviewed and updated as appropriate: allergies, current medications, past family history, past medical history, past social history, past surgical history and problem list.   Objective:   Filed Vitals:   06/05/15 0928  BP: 124/78  Pulse: 94  Temp: 98.2 F (36.8 C)  Weight: 141 lb 12.8 oz (64.32 kg)    Fetal Status: Fetal Heart Rate (bpm): 148 Fundal Height: 26 cm Movement: Present     General:  Alert, oriented and cooperative. Patient is in no acute distress.  Skin: Skin is warm and dry. No rash noted.   Cardiovascular: Normal heart rate noted  Respiratory: Normal respiratory effort, no problems with respiration noted  Abdomen: Soft, gravid, appropriate for gestational age. Pain/Pressure: Absent     Pelvic: Vag. Bleeding: None     Cervical exam deferred        Extremities: Normal range of motion.  Edema: None  Mental Status: Normal mood and affect. Normal behavior. Normal judgment and thought content.   Urinalysis: Urine Protein: Negative Urine Glucose: 3+  Assessment and Plan:  Pregnancy: G5P4004 at 260w6d# BDM - increasing NPH from 32/14 to 34/16 and novlolg from 10/10 to 12/12\ - a1c - has seen ophtho - q4 wk growth scans scheduled - f/u 1 wk diabetes educator, 2 wks established prenatal  # Multiple fetal anomolies - met w/ peds neuro, discussed possible needs for neurosurgery and other surgery postpartum  # LSIL - [facility], hpv not checked - confirmed this result after patient left - at next visit will assess whether willing to get colpo during pregnancy or defer until after    There are  no diagnoses linked to this encounter. Preterm labor symptoms and general obstetric precautions including but not limited to vaginal bleeding, contractions, leaking of fluid and fetal movement were reviewed in detail with the patient. Please refer to After Visit Summary for other counseling recommendations.  No Follow-up on file.   NoGwynne EdingerMD

## 2015-06-07 ENCOUNTER — Encounter: Payer: Self-pay | Admitting: *Deleted

## 2015-06-12 ENCOUNTER — Ambulatory Visit: Payer: Self-pay | Admitting: *Deleted

## 2015-06-12 ENCOUNTER — Encounter: Payer: Self-pay | Attending: Family Medicine | Admitting: *Deleted

## 2015-06-12 DIAGNOSIS — Z713 Dietary counseling and surveillance: Secondary | ICD-10-CM | POA: Insufficient documentation

## 2015-06-12 DIAGNOSIS — O24419 Gestational diabetes mellitus in pregnancy, unspecified control: Secondary | ICD-10-CM

## 2015-06-12 DIAGNOSIS — Z794 Long term (current) use of insulin: Secondary | ICD-10-CM | POA: Insufficient documentation

## 2015-06-12 DIAGNOSIS — O24919 Unspecified diabetes mellitus in pregnancy, unspecified trimester: Secondary | ICD-10-CM | POA: Insufficient documentation

## 2015-06-12 NOTE — Progress Notes (Signed)
Patient present for review of glucose readings. Numbers are not being logged in book correctly. Ex. 10035 = 135 etc. Education provided by Interpreter Maretta Los. Proposed FBS range 140-73mg /dl 2hpp 161-096 with #5 readings being #130. I question accuracy of readings. Patient did not have meter available at this time. Patient notes that she is awakening between MN and 0200 not feeling. My recommendation is that we decrease dinner insulin (8pm) which is potentially decreasing her glucose.  I have requested that if she feels low during the night to test her glucose and record in her log book.  Log glucose readings using the technique shown that reflects the exact 2-3 numbers shown on the screen. Bring glucometer next week so that we can validate that it is working properly. Reviewed above with Dr. Ashok Pall. Patient scheduled for appt next week.

## 2015-06-19 ENCOUNTER — Other Ambulatory Visit (HOSPITAL_COMMUNITY): Payer: Self-pay | Admitting: *Deleted

## 2015-06-19 ENCOUNTER — Ambulatory Visit (HOSPITAL_COMMUNITY)
Admission: RE | Admit: 2015-06-19 | Discharge: 2015-06-19 | Disposition: A | Discharge status: Home / Self Care | Payer: Self-pay | Source: Ambulatory Visit | Attending: Family Medicine | Admitting: Family Medicine

## 2015-06-19 ENCOUNTER — Encounter (HOSPITAL_COMMUNITY): Payer: Self-pay

## 2015-06-19 ENCOUNTER — Ambulatory Visit (INDEPENDENT_AMBULATORY_CARE_PROVIDER_SITE_OTHER): Payer: Self-pay | Admitting: Family Medicine

## 2015-06-19 VITALS — BP 127/89 | HR 88 | Temp 98.0°F | Wt 144.3 lb

## 2015-06-19 DIAGNOSIS — O3505X0 Maternal care for (suspected) central nervous system malformation or damage in fetus, holoprosencephaly, not applicable or unspecified: Secondary | ICD-10-CM

## 2015-06-19 DIAGNOSIS — O0993 Supervision of high risk pregnancy, unspecified, third trimester: Secondary | ICD-10-CM

## 2015-06-19 DIAGNOSIS — O350XX0 Maternal care for (suspected) central nervous system malformation in fetus, not applicable or unspecified: Secondary | ICD-10-CM | POA: Insufficient documentation

## 2015-06-19 DIAGNOSIS — Z3492 Encounter for supervision of normal pregnancy, unspecified, second trimester: Secondary | ICD-10-CM

## 2015-06-19 DIAGNOSIS — Z23 Encounter for immunization: Secondary | ICD-10-CM

## 2015-06-19 DIAGNOSIS — O3505X1 Maternal care for (suspected) central nervous system malformation or damage in fetus, holoprosencephaly, fetus 1: Secondary | ICD-10-CM

## 2015-06-19 DIAGNOSIS — O3508X0 Maternal care for (suspected) central nervous system malformation or damage in fetus, spina bifida, not applicable or unspecified: Secondary | ICD-10-CM

## 2015-06-19 DIAGNOSIS — O24919 Unspecified diabetes mellitus in pregnancy, unspecified trimester: Secondary | ICD-10-CM

## 2015-06-19 DIAGNOSIS — O350XX1 Maternal care for (suspected) central nervous system malformation in fetus, fetus 1: Secondary | ICD-10-CM

## 2015-06-19 DIAGNOSIS — O24312 Unspecified pre-existing diabetes mellitus in pregnancy, second trimester: Secondary | ICD-10-CM | POA: Insufficient documentation

## 2015-06-19 DIAGNOSIS — O24319 Unspecified pre-existing diabetes mellitus in pregnancy, unspecified trimester: Secondary | ICD-10-CM

## 2015-06-19 LAB — CBC
HCT: 36.5 % (ref 36.0–46.0)
Hemoglobin: 12.5 g/dL (ref 12.0–15.0)
MCH: 28.9 pg (ref 26.0–34.0)
MCHC: 34.2 g/dL (ref 30.0–36.0)
MCV: 84.3 fL (ref 78.0–100.0)
MPV: 9.1 fL (ref 8.6–12.4)
PLATELETS: 326 10*3/uL (ref 150–400)
RBC: 4.33 MIL/uL (ref 3.87–5.11)
RDW: 13.5 % (ref 11.5–15.5)
WBC: 10 10*3/uL (ref 4.0–10.5)

## 2015-06-19 LAB — POCT URINALYSIS DIP (DEVICE)
Bilirubin Urine: NEGATIVE
Glucose, UA: NEGATIVE mg/dL
Hgb urine dipstick: NEGATIVE
Ketones, ur: NEGATIVE mg/dL
Nitrite: NEGATIVE
PROTEIN: NEGATIVE mg/dL
SPECIFIC GRAVITY, URINE: 1.015 (ref 1.005–1.030)
UROBILINOGEN UA: 0.2 mg/dL (ref 0.0–1.0)
pH: 6.5 (ref 5.0–8.0)

## 2015-06-19 MED ORDER — TETANUS-DIPHTH-ACELL PERTUSSIS 5-2.5-18.5 LF-MCG/0.5 IM SUSP
0.5000 mL | Freq: Once | INTRAMUSCULAR | Status: AC
  Administered 2015-06-19: 0.5 mL via INTRAMUSCULAR

## 2015-06-19 NOTE — Progress Notes (Signed)
Subjective:  Joy Wallace is a 34 y.o. M5H8469 at [redacted]w[redacted]d being seen today for ongoing prenatal care.  Patient reports no complaints.  Contractions: Regular.  Vag. Bleeding: None. Movement: Present. Denies leaking of fluid.   The following portions of the patient's history were reviewed and updated as appropriate: allergies, current medications, past family history, past medical history, past social history, past surgical history and problem list.   Objective:   Filed Vitals:   06/19/15 1055  BP: 127/89  Pulse: 88  Temp: 98 F (36.7 C)  Weight: 144 lb 4.8 oz (65.454 kg)    Fetal Status: Fetal Heart Rate (bpm): 136   Movement: Present     General:  Alert, oriented and cooperative. Patient is in no acute distress.  Skin: Skin is warm and dry. No rash noted.   Cardiovascular: Normal heart rate noted  Respiratory: Normal respiratory effort, no problems with respiration noted  Abdomen: Soft, gravid, appropriate for gestational age. Pain/Pressure: Absent     Pelvic: Vag. Bleeding: None     Cervical exam deferred        Extremities: Normal range of motion.  Edema: None  Mental Status: Normal mood and affect. Normal behavior. Normal judgment and thought content.   Urinalysis: Urine Protein: Negative Urine Glucose: Negative   Fasting 84, 93, 43, 57, 99, 78, 73 PP bfast 140, 11, 138, 100, 152, 135, 123 Lug 158, 71, 118, 145, 130, 79 Dinner 240, 116, 116, 173, 90  Assessment and Plan:  Pregnancy: G5P4004 at [redacted]w[redacted]d  1. Prenatal care in second trimester - CBC - RPR - HIV antibody (with reflex)  2. Flu vaccine need - Flu Vaccine QUAD 36+ mos IM; Standing - Flu Vaccine QUAD 36+ mos IM  3. Need for Tdap vaccination - Tdap (BOOSTRIX) injection 0.5 mL; Inject 0.5 mLs into the muscle once.  4. Pre-existing diabetes mellitus affecting pregnancy, antepartum Only 1 of 7 fasting above 95, most elevated BS are postprandial, specifically lunch NPH 32 qAM; 14 qPM Increase to  Regular insulin 10 with each meal  5. Holoprosencephaly, fetal, affecting care of mother, antepartum, fetus 1 Wrote office coordinator to assure infant is being discussed at [facility]  6. Fetal spina bifida without hydrocephalus affecting antepartum care of mother, not applicable or unspecified fetus See above High risk for preeclampsia given open neural tube defect.   7. Supervision of high risk pregnancy, antepartum, third trimester Updated box, assured follow-up  Preterm labor symptoms and general obstetric precautions including but not limited to vaginal bleeding, contractions, leaking of fluid and fetal movement were reviewed in detail with the patient. Please refer to After Visit Summary for other counseling recommendations.  No Follow-up on file.   Federico Flake, MD  Future Appointments Date Time Provider Department Center  07/10/2015 9:05 AM Adam Phenix, MD [facility]  07/12/2015 1:30 PM Kathrynn Running, MD [facility]  07/17/2015 8:30 AM [facility] [place] 2 [facility]

## 2015-06-19 NOTE — Progress Notes (Signed)
Breastfeeding tip of the week reviewed Tdap/flu vaccine given  28 wk labs 28 wk education material given Pearletha Alfred Spanish Interpreter for encounter Pt got stung by a bee last pm on right foot, complains of pain

## 2015-06-19 NOTE — Patient Instructions (Signed)
Para su pie (piquete de abeja) Toma Benadryl cada 6 horas si necesita, Toma por lo menos una vex hoy Toma Tylenol si necessita Applica hielo a su pie cada 6 horas)  Tercer trimestre de Psychiatrist (Third Trimester of Pregnancy) El tercer trimestre va desde la semana29 hasta la 42, desde el sptimo hasta el noveno mes, y es la poca en la que el feto crece ms rpidamente. Hacia el final del noveno mes, el feto mide alrededor de 20pulgadas (45cm) de largo y pesa entre 6 y 10 libras (2,700 y 37,500kg).  CAMBIOS EN EL ORGANISMO Su organismo atraviesa por muchos cambios durante el [place], y estos varan de Neomia Dear mujer a Educational psychologist.   Seguir [employer]. Es de esperar que aumente entre 25 y 35libras (11 y 16kg) hacia el final del Psychiatrist.  Podrn aparecer las primeras [employer] caderas, el abdomen y las [place].  Puede tener necesidad de Geographical information systems officer con ms frecuencia porque el feto baja hacia la pelvis y ejerce presin sobre la vejiga.  Debido al Vanetta Mulders podr sentir Anthoney Harada estomacal con frecuencia.  Puede estar estreida, ya que ciertas hormonas enlentecen los movimientos de los msculos que [employer] desechos a travs de los intestinos.  Pueden aparecer hemorroides o abultarse e hincharse las venas (venas varicosas).  Puede sentir dolor plvico debido al [employer] y a que las hormonas del Management consultant las articulaciones entre los huesos de la pelvis. El dolor de espalda puede ser consecuencia de la sobrecarga de los msculos que soportan la [place].  Tal vez haya cambios en el cabello que pueden incluir su engrosamiento, crecimiento rpido y cambios en la textura. Adems, a algunas mujeres se les cae el cabello durante o despus del embarazo, o tienen el cabello seco o fino. Lo ms probable es que el cabello se le normalice despus del nacimiento del beb.  Las [employer] seguirn creciendo y Development worker, community. A veces, puede haber una secrecin amarilla de las mamas llamada  calostro.  El ombligo puede salir hacia afuera.  Puede sentir que le falta el aire debido a que se expande el tero.  Puede notar que el feto "baja" o lo siente ms bajo, en el abdomen.  Puede tener una prdida de secrecin mucosa con sangre. Esto suele ocurrir en el trmino de unos [address] a una semana antes de que comience el [place].  El cuello del tero se vuelve delgado y blando (se borra) cerca de la fecha de [place]. QU DEBE ESPERAR EN LOS EXMENES PRENATALES  Le harn exmenes prenatales cada 2semanas hasta la semana36. A partir de ese momento le harn exmenes semanales. Durante una visita prenatal de rutina:  La pesarn para asegurarse de que usted y el feto estn creciendo normalmente.  Le tomarn la presin arterial.  Le medirn el abdomen para controlar el desarrollo del beb.  Se escucharn los latidos cardacos fetales.  Se evaluarn los resultados de los estudios solicitados en visitas anteriores.  Le revisarn el cuello del tero cuando est prxima la fecha de parto para controlar si este se ha borrado. Alrededor de la semana36, el mdico le revisar el cuello del tero. Al mismo tiempo, realizar un anlisis de las secreciones del tejido vaginal. Este examen es para determinar si hay un tipo de bacteria, estreptococo Grupo B. El mdico le explicar esto con ms detalle. El mdico puede preguntarle lo siguiente:  Cmo le gustara que fuera el [place].  Cmo se siente.  Si siente los movimientos del  beb.  Si ha tenido sntomas anormales, como prdida de lquido, sangrado, dolores de cabeza intensos o clicos abdominales.  Si tiene [employer]. Otros exmenes o estudios de deteccin que pueden realizarse durante el tercer trimestre incluyen lo siguiente:  Anlisis de sangre para controlar las concentraciones de hierro (anemia).  Controles fetales para determinar su salud, nivel de [place] y Designer, jewellery. Si tiene [place] enfermedad o hay  problemas durante el embarazo, le harn estudios. FALSO TRABAJO DE PARTO Es posible que sienta contracciones leves e irregulares que finalmente desaparecen. Se llaman contracciones de [address] o falso trabajo de [place]. Las [employer] pueden durar horas, [address] o incluso semanas, antes de que el verdadero trabajo de parto se inicie. Si las contracciones ocurren a intervalos regulares, se intensifican o se hacen dolorosas, lo mejor es que la revise el mdico.  SIGNOS DE TRABAJO DE PARTO   Clicos de tipo menstrual.  Contracciones cada o menos.  Contracciones que comienzan en la parte superior del tero y se extienden hacia abajo, a la zona inferior del abdomen y la espalda.  Sensacin de mayor presin en la pelvis o dolor de espalda.  Una secrecin de mucosidad acuosa o con sangre que sale de la vagina. Si tiene alguno de estos signos antes de la semana37 del Psychiatrist, llame a su mdico de inmediato. Debe concurrir al hospital para que la controlen inmediatamente. INSTRUCCIONES PARA EL CUIDADO EN EL HOGAR   Evite fumar, consumir hierbas, beber alcohol y tomar frmacos que no le hayan recetado. Estas sustancias qumicas afectan la formacin y el desarrollo del beb.  Siga las indicaciones del mdico en relacin con el uso de medicamentos. Durante el embarazo, hay medicamentos que son seguros de tomar y otros que no.  Haga actividad fsica solo en la forma indicada por el mdico. Sentir clicos uterinos es un buen signo para Restaurant manager, fast food actividad fsica.  Contine comiendo alimentos que sanos con regularidad.  Use un sostn que le brinde buen soporte si le [employer].  No se d baos de inmersin en agua caliente, baos turcos ni saunas.  Colquese el cinturn de seguridad cuando conduzca.  No coma carne cruda ni queso sin cocinar; evite el contacto con las bandejas sanitarias de los gatos y la tierra que estos animales usan. Estos elementos contienen grmenes que  pueden causar defectos congnitos en el beb.  Tome las vitaminas prenatales.  Si est estreida, pruebe un laxante suave (si el mdico lo autoriza). Consuma ms alimentos ricos en fibra, como vegetales y frutas frescos y Radiation protection practitioner. Beba gran cantidad de lquido para mantener la orina de tono claro o color amarillo plido.  Dese baos de asiento con agua tibia para Engineer, materials o las molestias causadas por las hemorroides. Use una crema para las hemorroides si el mdico la autoriza.  Si tiene venas varicosas, use medias de descanso. Eleve los pies durante , 3 o 4veces por da. Limite la cantidad de sal en su dieta.  Evite levantar objetos pesados, use zapatos de tacones bajos y [place].  Descanse con las piernas elevadas si tiene calambres o dolor de cintura.  Visite a su dentista si no lo ha Occupational hygienist. Use un cepillo de dientes blando para higienizarse los dientes y psese el hilo dental con suavidad.  Puede seguir [employer], a menos que el mdico le indique lo contrario.  No haga viajes largos excepto que sea absolutamente necesario y solo con la autorizacin del  mdico.  Tome clases prenatales para entender, Education administratorpracticar y hacer preguntas sobre el Friendshiptrabajo de parto y [place] parto.  Haga un ensayo de la partida al hospital.  Prepare el bolso que llevar al hospital.  Prepare la habitacin del beb.  Concurra a todas las visitas prenatales segn las indicaciones de su mdico. SOLICITE ATENCIN MDICA SI:  No est segura de que est en trabajo de parto o de que ha roto la bolsa de las aguas.  Tiene mareos.  Siente clicos leves, presin en la pelvis o dolor persistente en el abdomen.  Tiene nuseas, vmitos o diarrea persistentes.  Tiene secrecin vaginal con mal olor.  Siente dolor al ConocoPhillipsorinar. SOLICITE ATENCIN MDICA DE INMEDIATO SI:   Tiene fiebre.  Tiene una prdida de lquido por la  vagina.  Tiene sangrado o pequeas prdidas vaginales.  Siente dolor intenso o clicos en el abdomen.  Sube o baja de peso rpidamente.  Tiene dificultad para respirar y siente dolor de pecho.  Sbitamente se le hinchan mucho el rostro, las [place], los tobillos, los pies o las piernas.  No ha sentido los movimientos del beb durante Georgianne Fickuna hora.  Siente un dolor de cabeza intenso que no se alivia con medicamentos.  Hay cambios en la visin. Document Released: 07/17/2005 Document Revised: 10/12/2013 [facility] Patient Information 2015 [place], [place]. This information is not intended to replace advice given to you by your health care provider. Make sure you discuss any questions you have with your health care provider.

## 2015-06-20 LAB — RPR

## 2015-06-20 LAB — HIV ANTIBODY (ROUTINE TESTING W REFLEX): HIV 1&2 Ab, 4th Generation: NONREACTIVE

## 2015-06-27 ENCOUNTER — Telehealth: Payer: Self-pay | Admitting: *Deleted

## 2015-06-27 NOTE — Telephone Encounter (Signed)
Attempted to contact patient to give normal OB results.  No answer.  Will inform patient of results at next OB visit.

## 2015-07-10 ENCOUNTER — Ambulatory Visit (INDEPENDENT_AMBULATORY_CARE_PROVIDER_SITE_OTHER): Payer: Self-pay | Admitting: Obstetrics & Gynecology

## 2015-07-10 ENCOUNTER — Encounter: Payer: Self-pay | Attending: Family Medicine | Admitting: *Deleted

## 2015-07-10 VITALS — BP 110/69 | HR 86 | Temp 98.2°F | Wt 146.4 lb

## 2015-07-10 DIAGNOSIS — O350XX1 Maternal care for (suspected) central nervous system malformation in fetus, fetus 1: Secondary | ICD-10-CM

## 2015-07-10 DIAGNOSIS — O24919 Unspecified diabetes mellitus in pregnancy, unspecified trimester: Secondary | ICD-10-CM

## 2015-07-10 DIAGNOSIS — Z789 Other specified health status: Secondary | ICD-10-CM

## 2015-07-10 DIAGNOSIS — O3505X1 Maternal care for (suspected) central nervous system malformation or damage in fetus, holoprosencephaly, fetus 1: Secondary | ICD-10-CM

## 2015-07-10 DIAGNOSIS — O0993 Supervision of high risk pregnancy, unspecified, third trimester: Secondary | ICD-10-CM

## 2015-07-10 DIAGNOSIS — Z794 Long term (current) use of insulin: Secondary | ICD-10-CM | POA: Insufficient documentation

## 2015-07-10 DIAGNOSIS — Z713 Dietary counseling and surveillance: Secondary | ICD-10-CM | POA: Insufficient documentation

## 2015-07-10 DIAGNOSIS — O24319 Unspecified pre-existing diabetes mellitus in pregnancy, unspecified trimester: Secondary | ICD-10-CM

## 2015-07-10 DIAGNOSIS — R896 Abnormal cytological findings in specimens from other organs, systems and tissues: Secondary | ICD-10-CM

## 2015-07-10 DIAGNOSIS — IMO0002 Reserved for concepts with insufficient information to code with codable children: Secondary | ICD-10-CM

## 2015-07-10 LAB — POCT URINALYSIS DIP (DEVICE)
BILIRUBIN URINE: NEGATIVE
Glucose, UA: NEGATIVE mg/dL
HGB URINE DIPSTICK: NEGATIVE
KETONES UR: NEGATIVE mg/dL
Nitrite: NEGATIVE
PROTEIN: NEGATIVE mg/dL
SPECIFIC GRAVITY, URINE: 1.015 (ref 1.005–1.030)
Urobilinogen, UA: 0.2 mg/dL (ref 0.0–1.0)
pH: 7 (ref 5.0–8.0)

## 2015-07-10 NOTE — Progress Notes (Signed)
Subjective:  Joy Wallace is a 34 y.o. [id number] at [redacted]w[redacted]d being seen today for ongoing prenatal care and colposcopy for LGSIL pap. Patient is Spanish-speaking only, Spanish interpreter present for this encounter. Patient reports no complaints.  Contractions: Not present.  Vag. Bleeding: None. Movement: Present. Denies leaking of fluid.   The following portions of the patient's history were reviewed and updated as appropriate: allergies, current medications, past family history, past medical history, past social history, past surgical history and problem list.   Objective:   Filed Vitals:   07/10/15 0933  BP: 110/69  Pulse: 86  Temp: 98.2 F (36.8 C)  Weight: 146 lb 6.4 oz (66.407 kg)    Fetal Status: Fetal Heart Rate (bpm): 145 Fundal Height: 32 cm Movement: Present     General:  Alert, oriented and cooperative. Patient is in no acute distress.  Skin: Skin is warm and dry. No rash noted.   Cardiovascular: Normal heart rate noted  Respiratory: Normal respiratory effort, no problems with respiration noted  Abdomen: Soft, gravid, appropriate for gestational age. Pain/Pressure: Absent     Pelvic: Vag. Bleeding: None     Colposcopy done, see below        Extremities: Normal range of motion.  Edema: None  Mental Status: Normal mood and affect. Normal behavior. Normal judgment and thought content.   Urinalysis: Urine Protein: Negative Urine Glucose: Negative CBGs:  Few numbers, but mostly within range  GYNECOLOGY CLINIC COLPOSCOPY PROCEDURE NOTE 34 y.o. [id number] at [redacted]w[redacted]d here for colposcopy for low-grade squamous intraepithelial neoplasia (LGSIL - encompassing HPV,mild dysplasia,CIN I) pap smear on 04/03/15. Discussed role for HPV in cervical dysplasia, need for surveillance. Patient given informed consent, signed copy in the chart, time out was performed.  Placed in lithotomy position. Cervix viewed with speculum and colposcope after application of acetic acid.  Colposcopy  adequate? Yes No visible lesions; no biopsies obtained. Scant bleeding noticed. Will repeat cotesting in 12 months   Assessment and Plan:  Pregnancy: [id number] at [redacted]w[redacted]d  1. Pre-existing diabetes mellitus affecting pregnancy, antepartum Continue current insulin regimen  2. LGSIL (low grade squamous intraepithelial dysplasia) Colposcopy benign today, repeat cotesting in one year  3. Holoprosencephaly, fetal, affecting care of mother, antepartum, fetus 1 MAAC following  4. Language barrier affecting health care Interpreter present  5. Supervision of high risk pregnancy, antepartum, third trimester Preterm labor symptoms and general obstetric precautions including but not limited to vaginal bleeding, contractions, leaking of fluid and fetal movement were reviewed in detail with the patient. Please refer to After Visit Summary for other counseling recommendations.  Return in about 2 weeks (around 07/24/2015) for NST, OB Visit (needs 2x/week testing).   Tereso Newcomer, MD

## 2015-07-10 NOTE — Progress Notes (Signed)
Interpreter Joy Wallace

## 2015-07-10 NOTE — Patient Instructions (Addendum)
Regrese a la clinica cuando tenga su cita. Si tiene problemas o preguntas, llama a la clinica o vaya a la sala de emergencia al [facility].    

## 2015-07-10 NOTE — Progress Notes (Signed)
Glucose readings reviewed. Spanish interpreter present for language assistance. This AM FBS /dl and 1hpp pancake & chicken /dl. In review of log book patient is testing about half of the requested times. Other readings noted were pp 87-126mg /dl. She notes that some times her meter does not work (technique problem) properly and gives her an error message. She performed a reading in front of me with no difficulty. She notes that she just doesn't want to do it some times. Joy Wallace noted that she ran out of insulin for approximately 5 days so she didn't test because she knew the numbers would be too high.I stressed the importance of regular testing and documentation in log. She verbalized understanding.

## 2015-07-12 ENCOUNTER — Encounter: Payer: Self-pay | Admitting: Obstetrics and Gynecology

## 2015-07-17 ENCOUNTER — Ambulatory Visit (HOSPITAL_COMMUNITY)
Admission: RE | Admit: 2015-07-17 | Discharge: 2015-07-17 | Disposition: A | Discharge status: Home / Self Care | Payer: Self-pay | Source: Ambulatory Visit | Attending: Family Medicine | Admitting: Family Medicine

## 2015-07-17 ENCOUNTER — Encounter (HOSPITAL_COMMUNITY): Payer: Self-pay

## 2015-07-17 ENCOUNTER — Other Ambulatory Visit (HOSPITAL_COMMUNITY): Payer: Self-pay | Admitting: *Deleted

## 2015-07-17 ENCOUNTER — Other Ambulatory Visit (HOSPITAL_COMMUNITY): Payer: Self-pay | Admitting: Obstetrics and Gynecology

## 2015-07-17 DIAGNOSIS — O24919 Unspecified diabetes mellitus in pregnancy, unspecified trimester: Secondary | ICD-10-CM | POA: Insufficient documentation

## 2015-07-17 DIAGNOSIS — Z3A35 35 weeks gestation of pregnancy: Secondary | ICD-10-CM | POA: Insufficient documentation

## 2015-07-17 DIAGNOSIS — O358XX0 Maternal care for other (suspected) fetal abnormality and damage, not applicable or unspecified: Secondary | ICD-10-CM | POA: Insufficient documentation

## 2015-07-17 DIAGNOSIS — O359XX0 Maternal care for (suspected) fetal abnormality and damage, unspecified, not applicable or unspecified: Secondary | ICD-10-CM

## 2015-07-17 DIAGNOSIS — O359XX1 Maternal care for (suspected) fetal abnormality and damage, unspecified, fetus 1: Secondary | ICD-10-CM

## 2015-07-24 ENCOUNTER — Ambulatory Visit (HOSPITAL_COMMUNITY): Admission: RE | Admit: 2015-07-24 | Payer: Self-pay | Source: Ambulatory Visit

## 2015-07-24 ENCOUNTER — Telehealth: Payer: Self-pay | Admitting: *Deleted

## 2015-07-24 ENCOUNTER — Other Ambulatory Visit (HOSPITAL_COMMUNITY): Payer: Self-pay | Admitting: Maternal and Fetal Medicine

## 2015-07-24 ENCOUNTER — Other Ambulatory Visit: Payer: Self-pay

## 2015-07-24 DIAGNOSIS — O24313 Unspecified pre-existing diabetes mellitus in pregnancy, third trimester: Secondary | ICD-10-CM

## 2015-07-24 DIAGNOSIS — O359XX1 Maternal care for (suspected) fetal abnormality and damage, unspecified, fetus 1: Secondary | ICD-10-CM

## 2015-07-24 DIAGNOSIS — Z3A36 36 weeks gestation of pregnancy: Secondary | ICD-10-CM

## 2015-07-24 NOTE — Telephone Encounter (Signed)
Called patient with interpreter Joy Wallace to notify she missed her obfu/nst . She states she was told by doctors she needs to see specialist in [facility] and they will deliver her there. Reviewed chart with Dr. Jolayne Panther. She is getting pallative care, does not need nst's.

## 2015-07-27 ENCOUNTER — Other Ambulatory Visit: Payer: Self-pay

## 2015-08-14 ENCOUNTER — Encounter: Payer: Self-pay | Admitting: *Deleted

## 2015-08-31 ENCOUNTER — Ambulatory Visit: Payer: Self-pay | Admitting: Obstetrics and Gynecology

## 2016-02-08 ENCOUNTER — Encounter (HOSPITAL_COMMUNITY): Payer: Self-pay | Admitting: *Deleted
